# Patient Record
Sex: Female | Born: 1980 | Hispanic: No | Marital: Married | Smoking: Current every day smoker
Health system: Southern US, Community
[De-identification: ages and names within clinical notes are randomized; demographics above are authoritative.]

## PROBLEM LIST (undated history)

## (undated) DIAGNOSIS — K219 Gastro-esophageal reflux disease without esophagitis: Secondary | ICD-10-CM

## (undated) DIAGNOSIS — Z9049 Acquired absence of other specified parts of digestive tract: Secondary | ICD-10-CM

## (undated) DIAGNOSIS — T7840XA Allergy, unspecified, initial encounter: Secondary | ICD-10-CM

## (undated) DIAGNOSIS — F419 Anxiety disorder, unspecified: Secondary | ICD-10-CM

## (undated) DIAGNOSIS — J45909 Unspecified asthma, uncomplicated: Secondary | ICD-10-CM

## (undated) DIAGNOSIS — F32A Depression, unspecified: Secondary | ICD-10-CM

## (undated) HISTORY — DX: Acquired absence of other specified parts of digestive tract: Z90.49

## (undated) HISTORY — PX: TONSILLECTOMY: SUR1361

## (undated) HISTORY — DX: Gastro-esophageal reflux disease without esophagitis: K21.9

## (undated) HISTORY — DX: Unspecified asthma, uncomplicated: J45.909

## (undated) HISTORY — PX: LYMPHADENECTOMY: SHX5960

## (undated) HISTORY — DX: Allergy, unspecified, initial encounter: T78.40XA

## (undated) HISTORY — DX: Depression, unspecified: F32.A

## (undated) HISTORY — DX: Anxiety disorder, unspecified: F41.9

## (undated) HISTORY — PX: CHOLECYSTECTOMY: SHX55

---

## 2004-12-21 DIAGNOSIS — Z8742 Personal history of other diseases of the female genital tract: Secondary | ICD-10-CM

## 2004-12-21 HISTORY — DX: Personal history of other diseases of the female genital tract: Z87.42

## 2004-12-21 HISTORY — PX: COLPOSCOPY W/ BIOPSY / CURETTAGE: SUR283

## 2018-11-20 LAB — RESULTS CONSOLE HPV: CHL HPV: NEGATIVE

## 2018-11-20 LAB — HM PAP SMEAR: HM Pap smear: NORMAL

## 2020-10-01 ENCOUNTER — Ambulatory Visit (INDEPENDENT_AMBULATORY_CARE_PROVIDER_SITE_OTHER): Payer: 59 | Admitting: Medical-Surgical

## 2020-10-01 ENCOUNTER — Encounter: Payer: Self-pay | Admitting: Medical-Surgical

## 2020-10-01 VITALS — BP 124/87 | HR 72 | Temp 98.2°F | Ht 68.0 in | Wt 230.8 lb

## 2020-10-01 DIAGNOSIS — F419 Anxiety disorder, unspecified: Secondary | ICD-10-CM | POA: Diagnosis not present

## 2020-10-01 DIAGNOSIS — Z23 Encounter for immunization: Secondary | ICD-10-CM

## 2020-10-01 DIAGNOSIS — Z114 Encounter for screening for human immunodeficiency virus [HIV]: Secondary | ICD-10-CM

## 2020-10-01 DIAGNOSIS — Z7689 Persons encountering health services in other specified circumstances: Secondary | ICD-10-CM

## 2020-10-01 DIAGNOSIS — R6882 Decreased libido: Secondary | ICD-10-CM

## 2020-10-01 DIAGNOSIS — F32A Depression, unspecified: Secondary | ICD-10-CM | POA: Diagnosis not present

## 2020-10-01 DIAGNOSIS — Z1159 Encounter for screening for other viral diseases: Secondary | ICD-10-CM

## 2020-10-01 DIAGNOSIS — Z0000 Encounter for general adult medical examination without abnormal findings: Secondary | ICD-10-CM

## 2020-10-01 DIAGNOSIS — K219 Gastro-esophageal reflux disease without esophagitis: Secondary | ICD-10-CM

## 2020-10-01 NOTE — Progress Notes (Signed)
New Patient Office Visit  Subjective:  Patient ID: Martha Ware, female    DOB: Aug 04, 1981  Age: 39 y.o. MRN: [id number]  CC:  Chief Complaint  Patient presents with  . Establish Care    HPI Martha Ware presents to establish care. Relocated from [place] in April. Lives with her husband and their daughter, Martha Ware.  Depression/anxiety- worsened with moving, was prescribed medication (Effexor) which helped. Taking Bupropion 100mg  daily but was recently instructed to take it twice daily. Has not increased yet and has some questions about taking it. Feels that her anxiety is fairly constant but the depression is situational and labile. Had a deep breakthrough last week in therapy and is struggling this week to control her emotions. Has been tearful over the last couple of days. Admits that she doesn't often let herself cry. She is going to counseling once weekly.  Asthma- triggered by allergies. Albuterol inhaler prn. Now taking allergy shots to see if this will help.   Decreased libido- wonders if she is "broken" because she has no desire for sexual intimacy with her husband. She endorses being scared to get pregnant again as she already has a 23 year old daughter. The plan is for her husband to get a vasectomy but he has not done that yet. She is not interested in starting birth control at this time. Notes only one episode of intercourse in the past two years. Does not have time or resources to allow for date nights or time alone. Also, endorses difficulty with getting their schedules to mesh in order to participate in sexual relations. Does have periods of interest but these usually occur in the day or the afternoon when there is no one to take care of their daughter. Would like to avoid medication but is interested in possible ways to increase libido and resume marital relations.   Heartburn- has noticed discomfort in the upper chest in throat that is consistent with reflux each evening starting  a couple of hours before bed. Endorses waking with a bad taste in her mouth and having frequent throat clearing especially in the mornings. Takes Tums if it gets bad which helps some but has not tried any other medication.   Past Medical History:  Diagnosis Date  . Anxiety   . Asthma   . Depression   . Hx of abnormal cervical Pap smear 2006    Past Surgical History:  Procedure Laterality Date  . CHOLECYSTECTOMY    . COLPOSCOPY W/ BIOPSY / CURETTAGE  2006  . LYMPHADENECTOMY    . TONSILLECTOMY      Family History  Problem Relation Age of Onset  . Hypertension Mother   . Endometrial cancer Mother   . Healthy Father   . Hypothyroidism Maternal Grandmother   . Alcohol abuse Maternal Grandfather     Social History   Socioeconomic History  . Marital status: Married    Spouse name: Not on file  . Number of children: Not on file  . Years of education: Not on file  . Highest education level: Not on file  Occupational History  . Not on file  Tobacco Use  . Smoking status: Current Every Day Smoker    Types: E-cigarettes  . Smokeless tobacco: Never Used  Vaping Use  . Vaping Use: Every day  Substance and Sexual Activity  . Alcohol use: Yes    Alcohol/week: 10.0 standard drinks    Types: 10 Standard drinks or equivalent per week  . Drug use: Yes  Frequency: 4.0 times per week    Types: Marijuana  . Sexual activity: Not Currently    Partners: Male  Other Topics Concern  . Not on file  Social History Narrative  . Not on file   Social Determinants of Health   Financial Resource Strain:   . Difficulty of Paying Living Expenses: Not on file  Food Insecurity:   . Worried About Programme researcher, broadcasting/film/video in the Last Year: Not on file  . Ran Out of Food in the Last Year: Not on file  Transportation Needs:   . Lack of Transportation (Medical): Not on file  . Lack of Transportation (Non-Medical): Not on file  Physical Activity:   . Days of Exercise per Week: Not on file  .  Minutes of Exercise per Session: Not on file  Stress:   . Feeling of Stress : Not on file  Social Connections:   . Frequency of Communication with Friends and Family: Not on file  . Frequency of Social Gatherings with Friends and Family: Not on file  . Attends Religious Services: Not on file  . Active Member of Clubs or Organizations: Not on file  . Attends Banker Meetings: Not on file  . Marital Status: Not on file  Intimate Partner Violence:   . Fear of Current or Ex-Partner: Not on file  . Emotionally Abused: Not on file  . Physically Abused: Not on file  . Sexually Abused: Not on file    ROS Review of Systems  Constitutional: Positive for fatigue. Negative for chills and fever.  Respiratory: Negative for cough and shortness of breath.   Gastrointestinal:       Heartburn/reflux   Genitourinary: Negative for dyspareunia.  Allergic/Immunologic: Positive for environmental allergies.  Psychiatric/Behavioral: Positive for dysphoric mood. Negative for self-injury and suicidal ideas. The patient is nervous/anxious.        Decreased libido    Objective:   Today's Vitals: BP 124/87   Pulse 72   Temp 98.2 F (36.8 C) (Oral)   Ht 5\' 8"  (1.727 m)   Wt 230 lb 12.8 oz (104.7 kg)   LMP 09/23/2020 (Exact Date)   SpO2 99%   BMI 35.09 kg/m   Physical Exam Vitals and nursing note reviewed.  Constitutional:      General: She is not in acute distress.    Appearance: Normal appearance.  HENT:     Head: Normocephalic and atraumatic.  Cardiovascular:     Rate and Rhythm: Normal rate and regular rhythm.     Pulses: Normal pulses.     Heart sounds: Normal heart sounds. No murmur heard.  No friction rub. No gallop.   Pulmonary:     Effort: Pulmonary effort is normal. No respiratory distress.     Breath sounds: Normal breath sounds. No wheezing.  Skin:    General: Skin is warm and dry.  Neurological:     Mental Status: She is alert and oriented to person, place, and  time.  Psychiatric:        Attention and Perception: Attention normal.        Mood and Affect: Mood is anxious. Affect is tearful.        Speech: Speech normal.        Behavior: Behavior normal.        Thought Content: Thought content normal.        Cognition and Memory: Cognition normal.        Judgment: Judgment normal.  Assessment & Plan:   1. Encounter to establish care Reviewed available information and discussed health care concerns with patient.  We [place]  2. Anxiety/depression Continue weekly counseling.  Checking CBC, CMP, and TSH today.  Discussed increasing Wellbutrin dosage and the possibility of worsening anxiety.  She would still like to go ahead and increase this for now before considering a different medication.  Advised to take 100 mg twice daily, optimal dosing is 12 hours apart but okay to take on awakening in the morning and then again at bedtime if this is easier to remember.  If the nighttime dosing interferes with sleep, we may be able to change it to a 1 time a day extended release formula to see if that helps. - CBC - COMPLETE METABOLIC PANEL WITH GFR - TSH  3. Need for influenza vaccination Flu vaccine given in office today. - Flu Vaccine QUAD 36+ mos IM  4. Screening for HIV (human immunodeficiency virus) Discuss screening recommendations.  Patient is agreeable so we will add this to blood work today. - HIV Antibody (routine testing w rflx)  5. Need for hepatitis C screening test Discuss screening recommendations.  Patient is agreeable so we will add this to blood work today. - Hepatitis C antibody  6. Preventative health care Since she is having lab work drawn today we will go ahead and order a lipid panel for preventative health care.  She will return in a few weeks for follow-up on mood and to have her physical completed.  No preventative health care provided today. - Lipid panel  7.  Gastroesophageal reflux disease without esophagitis Discussed causes and symptoms of GERD.  On review of her diet, she does eat and drink several things that can worsen GERD.  She would like to avoid extra medications at this time and make lifestyle changes.  Provided GERD information with her AVS for review.  8.  Decreased libido Discussed the role of depression and anxiety and decreased libido.  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  Recommended behavioral modifications such as scheduled date nights/adult time where she and her husband can reconnect on an adult level.  Also discussed careful use of condoms for birth control until he has his vasectomy completed to prevent pregnancy.  We will look into further options that may be of some benefit.  As of now, she does not feel it possible to set aside date nights and adult time as they do not have the money or the resources for childcare.  Outpatient Encounter Medications as of 10/01/2020  Medication Sig  . albuterol (VENTOLIN HFA) 108 (90 Base) MCG/ACT inhaler Inhale 2 puffs into the lungs every 6 (six) hours as needed.   Marland Kitchen. b complex vitamins capsule Take 1 capsule by mouth daily.  Marland Kitchen. buPROPion (WELLBUTRIN) 100 MG tablet Take 100 mg by mouth daily.   . Cholecalciferol (VITAMIN D-3) 125 MCG (5000 UT) TABS Take 1 tablet by mouth daily.  . cloNIDine (CATAPRES) 0.1 MG tablet Take 0.1 mg by mouth daily as needed.  . fexofenadine (ALLEGRA) 180 MG tablet Take 180 mg by mouth daily.  . fluticasone (FLONASE) 50 MCG/ACT nasal spray Place 1 spray into both nostrils daily.  . Multiple Vitamin tablet Take 1 tablet by mouth daily.  . Omega-3 Fatty Acids (FISH OIL) 1000 MG CAPS  Take 1 capsule by mouth daily.   No facility-administered encounter medications on file as of 10/01/2020.    Follow-up: Return in about 4 weeks (around  10/29/2020) for mood follow up.   Thayer Ohm, DNP, APRN, FNP-BC [facility]

## 2020-10-01 NOTE — Patient Instructions (Addendum)
Influenza (Flu) Vaccine (Inactivated or Recombinant): What You Need to Know 1. Why get vaccinated? Influenza vaccine can prevent influenza (flu). Flu is a contagious disease that spreads around the [place] every year, usually between October and May. Anyone can get the flu, but it is more dangerous for some people. Infants and young children, people 38 years of age and older, pregnant women, and people with certain health conditions or a weakened immune system are at greatest risk of flu complications. Pneumonia, bronchitis, sinus infections and ear infections are examples of flu-related complications. If you have a medical condition, such as heart disease, cancer or diabetes, flu can make it worse. Flu can cause fever and chills, sore throat, muscle aches, fatigue, cough, headache, and runny or stuffy nose. Some people may have vomiting and diarrhea, though this is more common in children than adults. Each year thousands of people in the [place] die from flu, and many more are hospitalized. Flu vaccine prevents millions of illnesses and flu-related visits to the doctor each year. 2. Influenza vaccine CDC recommends everyone 57 months of age and older get vaccinated every flu season. Children 6 months through 2 years of age may need 2 doses during a single flu season. Everyone else needs only 1 dose each flu season. It takes about 2 weeks for protection to develop after vaccination. There are many flu viruses, and they are always changing. Each year a new flu vaccine is made to protect against three or four viruses that are likely to cause disease in the upcoming flu season. Even when the vaccine doesn't exactly match these viruses, it may still provide some protection. Influenza vaccine does not cause flu. Influenza vaccine may be given at the same time as other vaccines. 3. Talk with your health care provider Tell your vaccine provider if the person getting the vaccine:  Has had an  allergic reaction after a previous dose of influenza vaccine, or has any severe, life-threatening allergies.  Has ever had Guillain-Barr Syndrome (also called GBS). In some cases, your health care provider may decide to postpone influenza vaccination to a future visit. People with minor illnesses, such as a cold, may be vaccinated. People who are moderately or severely ill should usually wait until they recover before getting influenza vaccine. Your health care provider can give you more information. 4. Risks of a vaccine reaction  Soreness, redness, and swelling where shot is given, fever, muscle aches, and headache can happen after influenza vaccine.  There may be a very small increased risk of Guillain-Barr Syndrome (GBS) after inactivated influenza vaccine (the flu shot). Young children who get the flu shot along with pneumococcal vaccine (PCV13), and/or DTaP vaccine at the same time might be slightly more likely to have a seizure caused by fever. Tell your health care provider if a child who is getting flu vaccine has ever had a seizure. People sometimes faint after medical procedures, including vaccination. Tell your provider if you feel dizzy or have vision changes or ringing in the ears. As with any medicine, there is a very remote chance of a vaccine causing a severe allergic reaction, other serious injury, or death. 5. What if there is a serious problem? An allergic reaction could occur after the vaccinated person leaves the clinic. If you see signs of a severe allergic reaction (hives, swelling of the face and throat, difficulty breathing, a fast heartbeat, dizziness, or weakness), call 9-1-1 and get the person to the nearest hospital. For other signs that  concern you, call your health care provider. Adverse reactions should be reported to the Vaccine Adverse Event Reporting System (VAERS). Your health care provider will usually file this report, or you can do it yourself. Visit the  VAERS website at www.vaers.LAgents.no or call (670)356-7045.VAERS is only for reporting reactions, and VAERS staff do not give medical advice. 6. The National Vaccine Injury Compensation Program The [employer] Vaccine Injury Compensation Program (VICP) is a federal program that was created to compensate people who may have been injured by certain vaccines. Visit the VICP website at SpiritualWord.at or call (415)112-0681 to learn about the program and about filing a claim. There is a time limit to file a claim for compensation. 7. How can I learn more?  Ask your healthcare provider.  Call your local or state health department.  Contact the Centers for Disease Control and Prevention (CDC): ? Call (330) 747-0319 (1-800-CDC-INFO) or ? Visit CDC's BiotechRoom.com.cy Vaccine Information Statement (Interim) Inactivated Influenza Vaccine (08/04/2018) This information is not intended to replace advice given to you by your health care provider. Make sure you discuss any questions you have with your health care provider. Document Revised: 03/28/2019 Document Reviewed: 08/08/2018 Elsevier Patient Education  2020 Elsevier Inc.   Gastroesophageal Reflux Disease, Adult Gastroesophageal reflux (GER) happens when acid from the stomach flows up into the tube that connects the mouth and the stomach (esophagus). Normally, food travels down the esophagus and stays in the stomach to be digested. With GER, food and stomach acid sometimes move back up into the esophagus. You may have a disease called gastroesophageal reflux disease (GERD) if the reflux:  Happens often.  Causes frequent or very bad symptoms.  Causes problems such as damage to the esophagus. When this happens, the esophagus becomes sore and swollen (inflamed). Over time, GERD can make small holes (ulcers) in the lining of the esophagus. What are the causes? This condition is caused by a problem with the muscle between the esophagus and  the stomach. When this muscle is weak or not normal, it does not close properly to keep food and acid from coming back up from the stomach. The muscle can be weak because of:  Tobacco use.  Pregnancy.  Having a certain type of hernia (hiatal hernia).  Alcohol use.  Certain foods and drinks, such as coffee, chocolate, onions, and peppermint. What increases the risk? You are more likely to develop this condition if you:  Are overweight.  Have a disease that affects your connective tissue.  Use NSAID medicines. What are the signs or symptoms? Symptoms of this condition include:  Heartburn.  Difficult or painful swallowing.  The feeling of having a lump in the throat.  A bitter taste in the mouth.  Bad breath.  Having a lot of saliva.  Having an upset or bloated stomach.  Belching.  Chest pain. Different conditions can cause chest pain. Make sure you see your doctor if you have chest pain.  Shortness of breath or noisy breathing (wheezing).  Ongoing (chronic) cough or a cough at night.  Wearing away of the surface of teeth (tooth enamel).  Weight loss. How is this treated? Treatment will depend on how bad your symptoms are. Your doctor may suggest:  Changes to your diet.  Medicine.  Surgery. Follow these instructions at home: Eating and drinking   Follow a diet as told by your doctor. You may need to avoid foods and drinks such as: ? Coffee and tea (with or without caffeine). ? Drinks that contain  alcohol. ? Energy drinks and sports drinks. ? Bubbly (carbonated) drinks or sodas. ? Chocolate and cocoa. ? Peppermint and mint flavorings. ? Garlic and onions. ? Horseradish. ? Spicy and acidic foods. These include peppers, chili powder, curry powder, vinegar, hot sauces, and BBQ sauce. ? Citrus fruit juices and citrus fruits, such as oranges, lemons, and limes. ? Tomato-based foods. These include red sauce, chili, salsa, and pizza with red sauce. ? Fried  and fatty foods. These include donuts, french fries, potato chips, and high-fat dressings. ? High-fat meats. These include hot dogs, rib eye steak, sausage, ham, and bacon. ? High-fat dairy items, such as whole milk, butter, and cream cheese.  Eat small meals often. Avoid eating large meals.  Avoid drinking large amounts of liquid with your meals.  Avoid eating meals during the 2-3 hours before bedtime.  Avoid lying down right after you eat.  Do not exercise right after you eat. Lifestyle   Do not use any products that contain nicotine or tobacco. These include cigarettes, e-cigarettes, and chewing tobacco. If you need help quitting, ask your doctor.  Try to lower your stress. If you need help doing this, ask your doctor.  If you are overweight, lose an amount of weight that is healthy for you. Ask your doctor about a safe weight loss goal. General instructions  Pay attention to any changes in your symptoms.  Take over-the-counter and prescription medicines only as told by your doctor. Do not take aspirin, ibuprofen, or other NSAIDs unless your doctor says it is okay.  Wear loose clothes. Do not wear anything tight around your waist.  Raise (elevate) the head of your bed about 6 inches (15 cm).  Avoid bending over if this makes your symptoms worse.  Keep all follow-up visits as told by your doctor. This is important. Contact a doctor if:  You have new symptoms.  You lose weight and you do not know why.  You have trouble swallowing or it hurts to swallow.  You have wheezing or a cough that keeps happening.  Your symptoms do not get better with treatment.  You have a hoarse voice. Get help right away if:  You have pain in your arms, neck, jaw, teeth, or back.  You feel sweaty, dizzy, or light-headed.  You have chest pain or shortness of breath.  You throw up (vomit) and your throw-up looks like blood or coffee grounds.  You pass out (faint).  Your poop (stool)  is bloody or black.  You cannot swallow, drink, or eat. Summary  If a person has gastroesophageal reflux disease (GERD), food and stomach acid move back up into the esophagus and cause symptoms or problems such as damage to the esophagus.  Treatment will depend on how bad your symptoms are.  Follow a diet as told by your doctor.  Take all medicines only as told by your doctor. This information is not intended to replace advice given to you by your health care provider. Make sure you discuss any questions you have with your health care provider. Document Revised: 06/15/2018 Document Reviewed: 06/15/2018 Elsevier Patient Education  2020 [employer].

## 2020-10-02 ENCOUNTER — Encounter: Payer: Self-pay | Admitting: Medical-Surgical

## 2020-10-02 LAB — COMPLETE METABOLIC PANEL WITH GFR
AG Ratio: 1.7 (calc) (ref 1.0–2.5)
ALT: 20 U/L (ref 6–29)
AST: 15 U/L (ref 10–30)
Albumin: 4.4 g/dL (ref 3.6–5.1)
Alkaline phosphatase (APISO): 50 U/L (ref 31–125)
BUN: 9 mg/dL (ref 7–25)
CO2: 27 mmol/L (ref 20–32)
Calcium: 9.2 mg/dL (ref 8.6–10.2)
Chloride: 103 mmol/L (ref 98–110)
Creat: 0.83 mg/dL (ref 0.50–1.10)
GFR, Est African American: 104 mL/min/{1.73_m2} (ref >=60)
GFR, Est Non African American: 89 mL/min/{1.73_m2} (ref >=60)
Globulin: 2.6 g/dL (calc) (ref 1.9–3.7)
Glucose, Bld: 97 mg/dL (ref 65–99)
Potassium: 3.9 mmol/L (ref 3.5–5.3)
Sodium: 139 mmol/L (ref 135–146)
Total Bilirubin: 0.7 mg/dL (ref 0.2–1.2)
Total Protein: 7 g/dL (ref 6.1–8.1)

## 2020-10-02 LAB — LIPID PANEL
Cholesterol: 156 mg/dL (ref <200)
HDL: 58 mg/dL (ref >=50)
LDL Cholesterol (Calc): 80 mg/dL (calc)
Non-HDL Cholesterol (Calc): 98 mg/dL (calc) (ref <130)
Total CHOL/HDL Ratio: 2.7 (calc) (ref <5.0)
Triglycerides: 101 mg/dL (ref <150)

## 2020-10-02 LAB — CBC
HCT: 37.6 % (ref 35.0–45.0)
Hemoglobin: 12.7 g/dL (ref 11.7–15.5)
MCH: 29.7 pg (ref 27.0–33.0)
MCHC: 33.8 g/dL (ref 32.0–36.0)
MCV: 87.9 fL (ref 80.0–100.0)
MPV: 10 fL (ref 7.5–12.5)
Platelets: 264 10*3/uL (ref 140–400)
RBC: 4.28 10*6/uL (ref 3.80–5.10)
RDW: 13 % (ref 11.0–15.0)
WBC: 9.2 10*3/uL (ref 3.8–10.8)

## 2020-10-02 LAB — TSH: TSH: 2.09 mIU/L

## 2020-10-03 NOTE — Telephone Encounter (Signed)
Patient states she does not need an appointment at this time.

## 2020-10-29 ENCOUNTER — Ambulatory Visit (INDEPENDENT_AMBULATORY_CARE_PROVIDER_SITE_OTHER): Payer: 59 | Admitting: Medical-Surgical

## 2020-10-29 ENCOUNTER — Other Ambulatory Visit: Payer: Self-pay

## 2020-10-29 ENCOUNTER — Encounter: Payer: Self-pay | Admitting: Medical-Surgical

## 2020-10-29 VITALS — BP 122/83 | HR 72 | Temp 98.2°F | Ht 68.0 in | Wt 232.2 lb

## 2020-10-29 DIAGNOSIS — Z0000 Encounter for general adult medical examination without abnormal findings: Secondary | ICD-10-CM | POA: Diagnosis not present

## 2020-10-29 MED ORDER — BUPROPION HCL 100 MG PO TABS: 100.0000 mg | ORAL_TABLET | Freq: Two times a day (BID) | ORAL | 1 refills | Status: DC

## 2020-10-29 NOTE — Progress Notes (Signed)
HPI: Martha Ware is a 39 y.o. female who  has a past medical history of Anxiety, Asthma, Depression, and abnormal cervical Pap smear (2006).  she presents to [facility] today, 10/29/20,  for chief complaint of: Annual physical exam  Dentist: on the to do list, last visit 12/2017 Eye exam: due now, looking for ophthalmologist, wears glasses Exercise: walks, elliptical, intermittent weight machine Diet: no red meats, used to be vegan and leans towards healthy choices Pap smear: done after her daughter was born (she's 3y next weekend) COVID vaccine: J&J done, questions about boosters  Concerns: None  Past medical, surgical, social and family history reviewed:  Patient Active Problem List   Diagnosis Date Noted  . Anxiety 10/01/2020  . Depression 10/01/2020  . Gastroesophageal reflux disease without esophagitis 10/01/2020  . Decreased libido 10/01/2020    Past Surgical History:  Procedure Laterality Date  . CHOLECYSTECTOMY    . COLPOSCOPY W/ BIOPSY / CURETTAGE  2006  . LYMPHADENECTOMY    . TONSILLECTOMY      Social History   Tobacco Use  . Smoking status: Current Every Day Smoker    Types: E-cigarettes  . Smokeless tobacco: Never Used  Substance Use Topics  . Alcohol use: Yes    Alcohol/week: 10.0 standard drinks    Types: 10 Standard drinks or equivalent per week    Family History  Problem Relation Age of Onset  . Hypertension Mother   . Endometrial cancer Mother   . Healthy Father   . Hypothyroidism Maternal Grandmother   . Alcohol abuse Maternal Grandfather      Current medication list and allergy/intolerance information reviewed:    Current Outpatient Medications  Medication Sig Dispense Refill  . albuterol (VENTOLIN HFA) 108 (90 Base) MCG/ACT inhaler Inhale 2 puffs into the lungs every 6 (six) hours as needed.     Marland Kitchen b complex vitamins capsule Take 1 capsule by mouth daily.    Marland Kitchen buPROPion (WELLBUTRIN) 100 MG tablet Take  1 tablet (100 mg total) by mouth 2 (two) times daily. 180 tablet 1  . Cholecalciferol (VITAMIN D-3) 125 MCG (5000 UT) TABS Take 1 tablet by mouth daily.    . cloNIDine (CATAPRES) 0.1 MG tablet Take 0.1 mg by mouth daily as needed.    . fexofenadine (ALLEGRA) 180 MG tablet Take 180 mg by mouth daily.    . fluticasone (FLONASE) 50 MCG/ACT nasal spray Place 1 spray into both nostrils daily.    . Multiple Vitamin tablet Take 1 tablet by mouth daily.    . Omega-3 Fatty Acids (FISH OIL) 1000 MG CAPS Take 1 capsule by mouth daily.     No current facility-administered medications for this visit.   Allergies  Allergen Reactions  . Codeine Nausea And Vomiting   Review of Systems:  Constitutional:  No  fever, no chills, No recent illness, No unintentional weight changes. No significant fatigue.   HEENT: No headache, no vision change, no hearing change, No sore throat, + sinus pressure, + PND  Cardiac: No  chest pain, No  pressure, No palpitations, No  Orthopnea  Respiratory:  No  shortness of breath. + Cough, + wheeze  Gastrointestinal: No  abdominal pain, No  nausea, No  vomiting,  No  blood in stool, No  diarrhea, No  constipation   Musculoskeletal: No new myalgia/arthralgia  Skin: No  Rash, No other wounds/concerning lesions  Genitourinary: No  incontinence, No  abnormal genital bleeding, No abnormal genital discharge  Hem/Onc: +  easy bruising, No abnormal lymph node  Endocrine: No cold intolerance,  No heat intolerance. No polyuria/polydipsia/polyphagia   Neurologic: No  weakness, No  dizziness, No  slurred speech/focal weakness/facial droop  Psychiatric: No  concerns with depression, No  concerns with anxiety, + sleep problems, No mood problems  Exam:  BP 122/83   Pulse 72   Temp 98.2 F (36.8 C) (Oral)   Ht _0  (1.727 m)   Wt 232 lb 3.2 oz (105.3 kg)   LMP 10/17/2020   SpO2 97%   BMI 35.31 kg/m   Constitutional: VS see above. General Appearance: alert, well-developed,  well-nourished, NAD  Eyes: Normal lids and conjunctive, non-icteric sclera  Ears, Nose, Mouth, Throat: MMM, Normal external inspection ears. TM normal bilaterally.  Neck: No masses, trachea midline. No thyroid enlargement. No tenderness/mass appreciated. No lymphadenopathy  Respiratory: Normal respiratory effort. no wheeze, no rhonchi, no rales  Cardiovascular: S1/S2 normal, no murmur, no rub/gallop auscultated. RRR. No lower extremity edema. No carotid bruit or JVD. No abdominal aortic bruit.  Gastrointestinal: Nontender, no masses. No hepatomegaly, no splenomegaly. No hernia appreciated. Bowel sounds normal. Rectal exam deferred.   Musculoskeletal: Gait normal. No clubbing/cyanosis of digits.   Neurological: Normal balance/coordination. No tremor. No cranial nerve deficit on limited exam. Motor and sensation intact and symmetric. Cerebellar reflexes intact.   Skin: warm, dry, intact. No rash/ulcer. No concerning nevi or subq nodules on limited exam.    Psychiatric: Normal judgment/insight. Normal mood and affect. Oriented x3.   No results found for this or any previous visit (from the past 72 hour(s)).  No results found.  ASSESSMENT/PLAN:   1. Annual physical exam Preventative care labs already completed. Up to date on well woman care. Wellbutrin refilled at patient request at new dose fo 119m BID.   No orders of the defined types were placed in this encounter.  Meds ordered this encounter  Medications  . buPROPion (WELLBUTRIN) 100 MG tablet    Sig: Take 1 tablet (100 mg total) by mouth 2 (two) times daily.    Dispense:  180 tablet    Refill:  1    Order Specific Question:   Supervising Provider    Answer:   AEmeterio Reeve[[[id number]]   Patient Instructions  Preventive Care [age]Years Old, Female Preventive care refers to visits with your health care provider and lifestyle choices that can promote health and wellness. This includes:  A yearly physical exam. This  may also be called an annual well check.  Regular dental visits and eye exams.  Immunizations.  Screening for certain conditions.  Healthy lifestyle choices, such as eating a healthy diet, getting regular exercise, not using drugs or products that contain nicotine and tobacco, and limiting alcohol use. What can I expect for my preventive care visit? Physical exam Your health care provider will check your:  Height and weight. This may be used to calculate body mass index (BMI), which tells if you are at a healthy weight.  Heart rate and blood pressure.  Skin for abnormal spots. Counseling Your health care provider may ask you questions about your:  Alcohol, tobacco, and drug use.  Emotional well-being.  Home and relationship well-being.  Sexual activity.  Eating habits.  Work and work eStatistician  Method of birth control.  Menstrual cycle.  Pregnancy history. What immunizations do I need?  Influenza (flu) vaccine  This is recommended every year. Tetanus, diphtheria, and pertussis (Tdap) vaccine  You may need a Td booster every  10 years. Varicella (chickenpox) vaccine  You may need this if you have not been vaccinated. Human papillomavirus (HPV) vaccine  If recommended by your health care provider, you may need three doses over 6 months. Measles, mumps, and rubella (MMR) vaccine  You may need at least one dose of MMR. You may also need a second dose. Meningococcal conjugate (MenACWY) vaccine  One dose is recommended if you are age 65-21 years and a first-year college student living in a residence hall, or if you have one of several medical conditions. You may also need additional booster doses. Pneumococcal conjugate (PCV13) vaccine  You may need this if you have certain conditions and were not previously vaccinated. Pneumococcal polysaccharide (PPSV23) vaccine  You may need one or two doses if you smoke cigarettes or if you have certain  conditions. Hepatitis A vaccine  You may need this if you have certain conditions or if you travel or work in places where you may be exposed to hepatitis A. Hepatitis B vaccine  You may need this if you have certain conditions or if you travel or work in places where you may be exposed to hepatitis B. Haemophilus influenzae type b (Hib) vaccine  You may need this if you have certain conditions. You may receive vaccines as individual doses or as more than one vaccine together in one shot (combination vaccines). Talk with your health care provider about the risks and benefits of combination vaccines. What tests do I need?  Blood tests  Lipid and cholesterol levels. These may be checked every 5 years starting at age 12.  Hepatitis C test.  Hepatitis B test. Screening  Diabetes screening. This is done by checking your blood sugar (glucose) after you have not eaten for a while (fasting).  Sexually transmitted disease (STD) testing.  BRCA-related cancer screening. This may be done if you have a family history of breast, ovarian, tubal, or peritoneal cancers.  Pelvic exam and Pap test. This may be done every 3 years starting at age 29. Starting at age 80, this may be done every 5 years if you have a Pap test in combination with an HPV test. Talk with your health care provider about your test results, treatment options, and if necessary, the need for more tests. Follow these instructions at home: Eating and drinking   Eat a diet that includes fresh fruits and vegetables, whole grains, lean protein, and low-fat dairy.  Take vitamin and mineral supplements as recommended by your health care provider.  Do not drink alcohol if: ? Your health care provider tells you not to drink. ? You are pregnant, may be pregnant, or are planning to become pregnant.  If you drink alcohol: ? Limit how much you have to 0-1 drink a day. ? Be aware of how much alcohol is in your drink. In the [place] one  drink equals one 12 oz bottle of beer (355 mL), one 5 oz glass of wine (148 mL), or one 1 oz glass of hard liquor (44 mL). Lifestyle  Take daily care of your teeth and gums.  Stay active. Exercise for at least 30 minutes on 5 or more days each week.  Do not use any products that contain nicotine or tobacco, such as cigarettes, e-cigarettes, and chewing tobacco. If you need help quitting, ask your health care provider.  If you are sexually active, practice safe sex. Use a condom or other form of birth control (contraception) in order to prevent pregnancy and STIs (sexually transmitted  infections). If you plan to become pregnant, see your health care provider for a preconception visit. What's next?  Visit your health care provider once a year for a well check visit.  Ask your health care provider how often you should have your eyes and teeth checked.  Stay up to date on all vaccines. This information is not intended to replace advice given to you by your health care provider. Make sure you discuss any questions you have with your health care provider. Document Revised: 08/18/2018 Document Reviewed: 08/18/2018 Elsevier Patient Education  [place].  Follow-up plan: Return in about 1 year (around 10/29/2021) for annual physical exam or sooner if needed.  Clearnce Sorrel, DNP, APRN, FNP-BC [facility]

## 2020-10-29 NOTE — Patient Instructions (Signed)
Preventive Care 38-38 Years Old, Female Preventive care refers to visits with your health care provider and lifestyle choices that can promote health and wellness. This includes:  A yearly physical exam. This may also be called an annual well check.  Regular dental visits and eye exams.  Immunizations.  Screening for certain conditions.  Healthy lifestyle choices, such as eating a healthy diet, getting regular exercise, not using drugs or products that contain nicotine and tobacco, and limiting alcohol use. What can I expect for my preventive care visit? Physical exam Your health care provider will check your:  Height and weight. This may be used to calculate body mass index (BMI), which tells if you are at a healthy weight.  Heart rate and blood pressure.  Skin for abnormal spots. Counseling Your health care provider may ask you questions about your:  Alcohol, tobacco, and drug use.  Emotional well-being.  Home and relationship well-being.  Sexual activity.  Eating habits.  Work and work environment.  Method of birth control.  Menstrual cycle.  Pregnancy history. What immunizations do I need?  Influenza (flu) vaccine  This is recommended every year. Tetanus, diphtheria, and pertussis (Tdap) vaccine  You may need a Td booster every 10 years. Varicella (chickenpox) vaccine  You may need this if you have not been vaccinated. Human papillomavirus (HPV) vaccine  If recommended by your health care provider, you may need three doses over 6 months. Measles, mumps, and rubella (MMR) vaccine  You may need at least one dose of MMR. You may also need a second dose. Meningococcal conjugate (MenACWY) vaccine  One dose is recommended if you are age 19-21 years and a first-year college student living in a residence hall, or if you have one of several medical conditions. You may also need additional booster doses. Pneumococcal conjugate (PCV13) vaccine  You may need  this if you have certain conditions and were not previously vaccinated. Pneumococcal polysaccharide (PPSV23) vaccine  You may need one or two doses if you smoke cigarettes or if you have certain conditions. Hepatitis A vaccine  You may need this if you have certain conditions or if you travel or work in places where you may be exposed to hepatitis A. Hepatitis B vaccine  You may need this if you have certain conditions or if you travel or work in places where you may be exposed to hepatitis B. Haemophilus influenzae type b (Hib) vaccine  You may need this if you have certain conditions. You may receive vaccines as individual doses or as more than one vaccine together in one shot (combination vaccines). Talk with your health care provider about the risks and benefits of combination vaccines. What tests do I need?  Blood tests  Lipid and cholesterol levels. These may be checked every 5 years starting at age 20.  Hepatitis C test.  Hepatitis B test. Screening  Diabetes screening. This is done by checking your blood sugar (glucose) after you have not eaten for a while (fasting).  Sexually transmitted disease (STD) testing.  BRCA-related cancer screening. This may be done if you have a family history of breast, ovarian, tubal, or peritoneal cancers.  Pelvic exam and Pap test. This may be done every 3 years starting at age 21. Starting at age 30, this may be done every 5 years if you have a Pap test in combination with an HPV test. Talk with your health care provider about your test results, treatment options, and if necessary, the need for more tests.   Follow these instructions at home: Eating and drinking   Eat a diet that includes fresh fruits and vegetables, whole grains, lean protein, and low-fat dairy.  Take vitamin and mineral supplements as recommended by your health care provider.  Do not drink alcohol if: ? Your health care provider tells you not to drink. ? You are  pregnant, may be pregnant, or are planning to become pregnant.  If you drink alcohol: ? Limit how much you have to 0-1 drink a day. ? Be aware of how much alcohol is in your drink. In the [place] one drink equals one 12 oz bottle of beer (355 mL), one 5 oz glass of wine (148 mL), or one 1 oz glass of hard liquor (44 mL). Lifestyle  Take daily care of your teeth and gums.  Stay active. Exercise for at least 30 minutes on 5 or more days each week.  Do not use any products that contain nicotine or tobacco, such as cigarettes, e-cigarettes, and chewing tobacco. If you need help quitting, ask your health care provider.  If you are sexually active, practice safe sex. Use a condom or other form of birth control (contraception) in order to prevent pregnancy and STIs (sexually transmitted infections). If you plan to become pregnant, see your health care provider for a preconception visit. What's next?  Visit your health care provider once a year for a well check visit.  Ask your health care provider how often you should have your eyes and teeth checked.  Stay up to date on all vaccines. This information is not intended to replace advice given to you by your health care provider. Make sure you discuss any questions you have with your health care provider. Document Revised: 08/18/2018 Document Reviewed: 08/18/2018 Elsevier Patient Education  2020 [employer].

## 2021-04-27 ENCOUNTER — Other Ambulatory Visit: Payer: Self-pay | Admitting: Medical-Surgical

## 2021-07-22 ENCOUNTER — Encounter: Payer: Self-pay | Admitting: Medical-Surgical

## 2021-07-22 MED ORDER — BUPROPION HCL 100 MG PO TABS: 100.0000 mg | ORAL_TABLET | Freq: Two times a day (BID) | ORAL | 0 refills | Status: DC

## 2021-07-22 NOTE — Telephone Encounter (Signed)
Please look in the medical record as well as our box for records for Najia from [place].  If they are not available or you cannot find them, can you please fax the request and the release of information to the number in her MyChart message?  Thayer Ohm, DNP, APRN, FNP-BC [facility]

## 2021-07-24 ENCOUNTER — Telehealth: Payer: Self-pay

## 2021-07-24 NOTE — Telephone Encounter (Signed)
Faxed [place] medical records request per pt's mychart message.  Fax number - [phone]

## 2021-08-06 ENCOUNTER — Telehealth: Payer: 59 | Admitting: Physician Assistant

## 2021-08-06 DIAGNOSIS — N390 Urinary tract infection, site not specified: Secondary | ICD-10-CM

## 2021-08-06 MED ORDER — CEPHALEXIN 500 MG PO CAPS: 500.0000 mg | ORAL_CAPSULE | Freq: Two times a day (BID) | ORAL | 0 refills | Status: AC

## 2021-08-06 NOTE — Patient Instructions (Signed)
Pollyann Samples, thank you for joining [name], PA-C for today's virtual visit.  While this provider is not your primary care provider (PCP), if your PCP is located in our provider database this encounter information will be shared with them immediately following your visit.  Consent: (Patient) Martha Ware provided verbal consent for this virtual visit at the beginning of the encounter.  Current Medications:  Current Outpatient Medications:    albuterol (VENTOLIN HFA) 108 (90 Base) MCG/ACT inhaler, Inhale 2 puffs into the lungs every 6 (six) hours as needed. , Disp: , Rfl:    b complex vitamins capsule, Take 1 capsule by mouth daily., Disp: , Rfl:    buPROPion (WELLBUTRIN) 100 MG tablet, Take 1 tablet (100 mg total) by mouth 2 (two) times daily., Disp: 180 tablet, Rfl: 0   Cholecalciferol (VITAMIN D-3) 125 MCG (5000 UT) TABS, Take 1 tablet by mouth daily., Disp: , Rfl:    cloNIDine (CATAPRES) 0.1 MG tablet, Take 0.1 mg by mouth daily as needed., Disp: , Rfl:    fexofenadine (ALLEGRA) 180 MG tablet, Take 180 mg by mouth daily., Disp: , Rfl:    fluticasone (FLONASE) 50 MCG/ACT nasal spray, Place 1 spray into both nostrils daily., Disp: , Rfl:    Multiple Vitamin tablet, Take 1 tablet by mouth daily., Disp: , Rfl:    Omega-3 Fatty Acids (FISH OIL) 1000 MG CAPS, Take 1 capsule by mouth daily., Disp: , Rfl:    Medications ordered in this encounter:  No orders of the defined types were placed in this encounter.    *If you need refills on other medications prior to your next appointment, please contact your pharmacy*  Follow-Up: Call back or seek an in-person evaluation if the symptoms worsen or if the condition fails to improve as anticipated.  Other Instructions Your symptoms are consistent with a bladder infection, also called acute cystitis. Please take your antibiotic (Keflex) as directed until all pills are gone.  Stay very well hydrated.  Consider a daily probiotic (Align,  Culturelle, or Activia) to help prevent stomach upset caused by the antibiotic.  Taking a probiotic daily may also help prevent recurrent UTIs.  Also consider taking AZO (Phenazopyridine) tablets to help decrease pain with urination.    If symptoms are not resolving, anything worsens or new symptoms develop, you will need an in-person evaluation.   Urinary Tract Infection A urinary tract infection (UTI) can occur any place along the urinary tract. The tract includes the kidneys, ureters, bladder, and urethra. A type of germ called bacteria often causes a UTI. UTIs are often helped with antibiotic medicine.  HOME CARE  If given, take antibiotics as told by your doctor. Finish them even if you start to feel better. Drink enough fluids to keep your pee (urine) clear or pale yellow. Avoid tea, drinks with caffeine, and bubbly (carbonated) drinks. Pee often. Avoid holding your pee in for a long time. Pee before and after having sex (intercourse). Wipe from front to back after you poop (bowel movement) if you are a woman. Use each tissue only once. GET HELP RIGHT AWAY IF:  You have back pain. You have lower belly (abdominal) pain. You have chills. You feel sick to your stomach (nauseous). You throw up (vomit). Your burning or discomfort with peeing does not go away. You have a fever. Your symptoms are not better in 3 days. MAKE SURE YOU:  Understand these instructions. Will watch your condition. Will get help right away if you are  not doing well or get worse. Document Released: 05/25/2008 Document Revised: 08/31/2012 Document Reviewed: 07/07/2012 [facility] Patient Information 2015 [place], [place]. This information is not intended to replace advice given to you by your health care provider. Make sure you discuss any questions you have with your health care provider.    If you have been instructed to have an in-person evaluation today at a local Urgent Care facility, please use the link below.  It will take you to a list of all of our available [facility], including address, phone number and hours of operation. Please do not delay care.  [facility]  If you or a family member do not have a primary care provider, use the link below to schedule a visit and establish care. When you choose a [facility] primary care physician or advanced practice provider, you gain a long-term partner in health. Find a Primary Care Provider  Learn more about [facility]'s in-office and virtual care options: [facility] - Get Care Now

## 2021-08-06 NOTE — Progress Notes (Signed)
Virtual Visit Consent   [name], you are scheduled for a virtual visit with a [facility] provider today.     Just as with appointments in the office, your consent must be obtained to participate.  Your consent will be active for this visit and any virtual visit you may have with one of our providers in the next 365 days.     If you have a MyChart account, a copy of this consent can be sent to you electronically.  All virtual visits are billed to your insurance company just like a traditional visit in the office.    As this is a virtual visit, video technology does not allow for your provider to perform a traditional examination.  This may limit your provider's ability to fully assess your condition.  If your provider identifies any concerns that need to be evaluated in person or the need to arrange testing (such as labs, EKG, etc.), we will make arrangements to do so.     Although advances in technology are sophisticated, we cannot ensure that it will always work on either your end or our end.  If the connection with a video visit is poor, the visit may have to be switched to a telephone visit.  With either a video or telephone visit, we are not always able to ensure that we have a secure connection.     I need to obtain your verbal consent now.   Are you willing to proceed with your visit today?    Savvy Peeters has provided verbal consent on 08/06/2021 for a virtual visit (video or telephone).   Martha Ware, [place]   Date: 08/06/2021 12:52 PM   Virtual Visit via Video Note   I, Martha Ware, connected with  Kennedie Pardoe  ([id number], November 13, 1981) on 08/06/21 at 12:45 PM EDT by a video-enabled telemedicine application and verified that I am speaking with the correct person using two identifiers.  Location: Patient: Virtual Visit Location Patient: Home Provider: Virtual Visit Location Provider: Home Office   I discussed the limitations of evaluation and management by  telemedicine and the availability of in person appointments. The patient expressed understanding and agreed to proceed.    History of Present Illness: Martha Ware is a 40 y.o. who identifies as a female who was assigned female at birth, and is being seen today for possible UTI. Patient endorses symptoms starting Friday-Saturday with suprapubic pressure, urgency, frequency, incomplete bladder emptying and mild dysuria. As of this morning she is noting lower back pain. Denies fever, chills. Has noted nausea without any vomiting. Denies flank pain. LMP ended 07/25/2021. Started increasing fluid intake and starting cranberry juice over the weekend.   HPI: HPI  Problems:  Patient Active Problem List   Diagnosis Date Noted   Anxiety 10/01/2020   Depression 10/01/2020   Gastroesophageal reflux disease without esophagitis 10/01/2020   Decreased libido 10/01/2020    Allergies:  Allergies  Allergen Reactions   Codeine Nausea And Vomiting   Medications:  Current Outpatient Medications:    albuterol (VENTOLIN HFA) 108 (90 Base) MCG/ACT inhaler, Inhale 2 puffs into the lungs every 6 (six) hours as needed. , Disp: , Rfl:    b complex vitamins capsule, Take 1 capsule by mouth daily., Disp: , Rfl:    buPROPion (WELLBUTRIN) 100 MG tablet, Take 1 tablet (100 mg total) by mouth 2 (two) times daily., Disp: 180 tablet, Rfl: 0   cephALEXin (KEFLEX) 500 MG capsule, Take 1 capsule (500 mg  total) by mouth 2 (two) times daily for 7 days., Disp: 14 capsule, Rfl: 0   Cholecalciferol (VITAMIN D-3) 125 MCG (5000 UT) TABS, Take 1 tablet by mouth daily., Disp: , Rfl:    cloNIDine (CATAPRES) 0.1 MG tablet, Take 0.1 mg by mouth daily as needed., Disp: , Rfl:    fexofenadine (ALLEGRA) 180 MG tablet, Take 180 mg by mouth daily., Disp: , Rfl:    fluticasone (FLONASE) 50 MCG/ACT nasal spray, Place 1 spray into both nostrils daily., Disp: , Rfl:    Multiple Vitamin tablet, Take 1 tablet by mouth daily., Disp: , Rfl:     Omega-3 Fatty Acids (FISH OIL) 1000 MG CAPS, Take 1 capsule by mouth daily., Disp: , Rfl:   Observations/Objective: Patient is well-developed, well-nourished in no acute distress.  Resting comfortably at home.  Head is normocephalic, atraumatic.  No labored breathing. Speech is clear and coherent with logical content.  Patient is alert and oriented at baseline.   Assessment and Plan: 1. UTI (urinary tract infection), uncomplicated - cephALEXin (KEFLEX) 500 MG capsule; Take 1 capsule (500 mg total) by mouth 2 (two) times daily for 7 days.  Dispense: 14 capsule; Refill: 0 No alarm signs or symptoms. Classic UTI symptoms. Will treat empirically for uncomplicated cystitis with Keflex 500 mg BID x 7 days. Supportive measures and OTC medications reviewed. She is aware she will need an in-person evaluation for any non-resolving, new or worsening symptoms.   Follow Up Instructions: I discussed the assessment and treatment plan with the patient. The patient was provided an opportunity to ask questions and all were answered. The patient agreed with the plan and demonstrated an understanding of the instructions.  A copy of instructions were sent to the patient via MyChart.  The patient was advised to call back or seek an in-person evaluation if the symptoms worsen or if the condition fails to improve as anticipated.  Time:  I spent 12 minutes with the patient via telehealth technology discussing the above problems/concerns.    Martha Climes, PA-C

## 2021-09-22 ENCOUNTER — Other Ambulatory Visit: Payer: Self-pay

## 2021-09-22 ENCOUNTER — Ambulatory Visit (INDEPENDENT_AMBULATORY_CARE_PROVIDER_SITE_OTHER): Payer: 59 | Admitting: Medical-Surgical

## 2021-09-22 ENCOUNTER — Encounter: Payer: Self-pay | Admitting: Medical-Surgical

## 2021-09-22 VITALS — BP 113/80 | HR 67 | Resp 20 | Ht 68.0 in | Wt 245.0 lb

## 2021-09-22 DIAGNOSIS — K219 Gastro-esophageal reflux disease without esophagitis: Secondary | ICD-10-CM

## 2021-09-22 DIAGNOSIS — F419 Anxiety disorder, unspecified: Secondary | ICD-10-CM

## 2021-09-22 DIAGNOSIS — F32A Depression, unspecified: Secondary | ICD-10-CM

## 2021-09-22 DIAGNOSIS — N644 Mastodynia: Secondary | ICD-10-CM

## 2021-09-22 MED ORDER — BUPROPION HCL 100 MG PO TABS: 100.0000 mg | ORAL_TABLET | Freq: Two times a day (BID) | ORAL | 0 refills | Status: DC

## 2021-09-22 MED ORDER — PANTOPRAZOLE SODIUM 40 MG PO TBEC: 40.0000 mg | DELAYED_RELEASE_TABLET | Freq: Every day | ORAL | 3 refills | Status: DC

## 2021-09-22 NOTE — Patient Instructions (Signed)

## 2021-09-22 NOTE — Progress Notes (Signed)
HPI with pertinent ROS:   CC: Reflux, left nipple pain, mood follow-up  HPI: Pleasant 40 year old female presenting originally for physical however we had to change this to a problem visit as her last physical was less than 1 year ago.  GERD-this has been a longstanding issue and she has tried multiple things including diet changes, and avoidance of known triggers.  She continues to have significant issues with reflux and abdominal pain.  She is sleeping on 2 pillows at night.  She does have a dry cough.  Notes that her symptoms are very random and occur at various times and with various activities.  She is having regular bowel movements, at least 2/day which is typical for her.  She has tried Pepcid over-the-counter but it did not help.  Left nipple pain-breast-fed her daughter until she was approximately 71 months old.  At that point, her daughter bit her left nipple hard causing some subsequent issues.  She has had pain at the spot and has a small area that produces thick white discharge when squeezed.  This was evaluated by a mammogram as well as ultrasound and she was told it was fine.  Unfortunately, the area has begun to hurt and she describes it as a deep ache.  She does not mess with it on a regular basis and tries to leave it alone.  Has not found anything that seems to resolve the issue.  Was never treated with antibiotics at the time of injury.  Mood follow-up-taking Wellbutrin 100 mg twice daily as prescribed, tolerating well without side effects.  Does note that she is at a rough point in therapy and feels like the medication is working okay for the most part.  She does do therapy weekly and thinks that she may just be at a point where it is going to get tough for little while.  Would like to avoid making changes to her medications at this time.  I reviewed the past medical history, family history, social history, surgical history, and allergies today and no changes were needed.  Please  see the problem list section below in epic for further details.   Physical exam:   General: Well Developed, well nourished, and in no acute distress.  Neuro: Alert and oriented x3.  HEENT: Normocephalic, atraumatic  Skin: Warm and dry.  Small pinpoint white dot to the 11:00 location on the left nipple, no induration, erythema, or fluctuance noted.  No pain or tenderness reproducible on palpation. Cardiac: Regular rate and rhythm, no murmurs rubs or gallops, no lower extremity edema.  Respiratory: Clear to auscultation bilaterally. Not using accessory muscles, speaking in full sentences.  Impression and Recommendations:    1. Breast pain, left With discharge, it is suspicious for a clogged milk duct versus cyst.  Since mammogram and ultrasound were not diagnostic, getting MRI of the left breast without contrast for further evaluation. - MR BREAST LEFT WO CONTRAST; Future  2. Gastroesophageal reflux disease without esophagitis Information regarding foods to avoid provided with AVS.  Starting Protonix 40 mg daily. - pantoprazole (PROTONIX) 40 MG tablet; Take 1 tablet (40 mg total) by mouth daily.  Dispense: 30 tablet; Refill: 3  3. Anxiety 4. Depression, unspecified depression type Continue Wellbutrin 100 mg twice daily as prescribed. - buPROPion (WELLBUTRIN) 100 MG tablet; Take 1 tablet (100 mg total) by mouth 2 (two) times daily.  Dispense: 180 tablet; Refill: 0  Return in about 4 weeks (around 10/20/2021) for gerd follow up. ___________________________________________ Ander Slade L.  Charna Archer, DNP, APRN, FNP-BC [facility]

## 2021-10-20 ENCOUNTER — Telehealth: Payer: 59 | Admitting: Physician Assistant

## 2021-10-20 DIAGNOSIS — J069 Acute upper respiratory infection, unspecified: Secondary | ICD-10-CM

## 2021-10-20 MED ORDER — FLUTICASONE PROPIONATE 50 MCG/ACT NA SUSP: 2.0000 | Freq: Every day | NASAL | 0 refills | Status: DC

## 2021-10-20 MED ORDER — CHLORASEPTIC GARGLE 1.4 % MT LIQD: 1.0000 | OROMUCOSAL | 0 refills | Status: DC | PRN

## 2021-10-20 MED ORDER — GUAIFENESIN ER 600 MG PO TB12: 600.0000 mg | ORAL_TABLET | Freq: Two times a day (BID) | ORAL | 0 refills | Status: DC

## 2021-10-20 NOTE — Progress Notes (Signed)
Ms. Martha Ware, Martha Ware are scheduled for a virtual visit with your provider today.    Just as we do with appointments in the office, we must obtain your consent to participate.  Your consent will be active for this visit and any virtual visit you may have with one of our providers in the next 365 days.    If you have a MyChart account, I can also send a copy of this consent to you electronically.  All virtual visits are billed to your insurance company just like a traditional visit in the office.  As this is a virtual visit, video technology does not allow for your provider to perform a traditional examination.  This may limit your provider's ability to fully assess your condition.  If your provider identifies any concerns that need to be evaluated in person or the need to arrange testing such as labs, EKG, etc, we will make arrangements to do so.    Although advances in technology are sophisticated, we cannot ensure that it will always work on either your end or our end.  If the connection with a video visit is poor, we may have to switch to a telephone visit.  With either a video or telephone visit, we are not always able to ensure that we have a secure connection.   I need to obtain your verbal consent now.   Are you willing to proceed with your visit today?   Martha Ware has provided verbal consent on 10/20/2021 for a virtual visit (video or telephone).   Martha Meres, PA-C 10/20/2021  10:16 AM   Date:  10/20/2021   ID:  Martha Ware, DOB [birth year], MRN [id number]  Patient Location: Home Provider Location: Home Office   Participants: Patient and Provider for Visit and Wrap up  Method of visit: Video  Location of Patient: Home Location of Provider: Home Office Consent was obtain for visit over the video. Services rendered by provider: Visit was performed via video  A video enabled telemedicine application was used and I verified that I am speaking with the correct person using two  identifiers.  PCP:  Martha Butter, NP   Chief Complaint:  sore throat  History of Present Illness:    Martha Ware is a 40 y.o. female with history as stated below. Presents video telehealth for an acute care visit  Pt reports she had a uri earlier this month about 1-2 weeks ago. States she had been improving last week but this week her symptoms returned. She is mainly complaining of a sore throat.   She reports congestion but does not think that she has had post nasal drip. She does not have tonsils. Pain is diffuse and she is able to swallow and is tolerating secretions. She denies fever.  Past Medical, Surgical, Social History, Allergies, and Medications have been Reviewed.  Past Medical History:  Diagnosis Date   Anxiety    Asthma    Depression    Hx of abnormal cervical Pap smear 2006   Hx of cholecystectomy     No outpatient medications have been marked as taking for the 10/20/21 encounter (Appointment) with Samson Frederic, Merwin Breden S, PA-C.     Allergies:   Codeine   ROS See HPI for history of present illness.  Physical Exam Vitals and nursing note reviewed.  Constitutional:      General: She is not in acute distress.    Appearance: She is well-developed.  HENT:     Head: Normocephalic.  Mouth/Throat:     Comments: Difficult to visualize posterior oropharynx given quality of video but pt is tolerating secretions, has no trisumus, and has normal phonation. Eyes:     Conjunctiva/sclera: Conjunctivae normal.  Cardiovascular:     Rate and Rhythm: Normal rate.  Pulmonary:     Effort: Pulmonary effort is normal.  Musculoskeletal:        General: Normal range of motion.     Cervical back: Neck supple.  Skin:    General: Skin is warm and dry.  Neurological:     Mental Status: She is alert.             MDM: pt with sore throat and uri sxs. Sore throat likely from post nasal drip. Have given rx for fluticasone, mucinex, chloraseptic. Advised on symptomatic tx at home  and plan for f/u. Low suspicion for strep or pta/rpa at this time.   There are no diagnoses linked to this encounter.   Time:   Today, I have spent 15 minutes with the patient with telehealth technology discussing the above problems, reviewing the chart, previous notes, medications and orders.    Tests Ordered: No orders of the defined types were placed in this encounter.   Medication Changes: No orders of the defined types were placed in this encounter.    Disposition:  Follow up  Signed, Martha Meres, PA-C  10/20/2021 10:16 AM

## 2021-10-20 NOTE — Patient Instructions (Signed)
Pollyann Samples, thank you for joining Karrie Meres, PA-C for today's virtual visit.  While this provider is not your primary care provider (PCP), if your PCP is located in our provider database this encounter information will be shared with them immediately following your visit.  Consent: (Patient) Martha Ware provided verbal consent for this virtual visit at the beginning of the encounter.  Current Medications:  Current Outpatient Medications:    fluticasone (FLONASE) 50 MCG/ACT nasal spray, Place 2 sprays into both nostrils daily., Disp: 16 g, Rfl: 0   guaiFENesin (MUCINEX) 600 MG 12 hr tablet, Take 1 tablet (600 mg total) by mouth 2 (two) times daily., Disp: 6 tablet, Rfl: 0   phenol (CHLORASEPTIC GARGLE) 1.4 % LIQD, Use as directed 1 spray in the mouth or throat as needed for throat irritation / pain., Disp: 118 mL, Rfl: 0   albuterol (VENTOLIN HFA) 108 (90 Base) MCG/ACT inhaler, Inhale 2 puffs into the lungs every 6 (six) hours as needed. , Disp: , Rfl:    b complex vitamins capsule, Take 1 capsule by mouth daily., Disp: , Rfl:    buPROPion (WELLBUTRIN) 100 MG tablet, Take 1 tablet (100 mg total) by mouth 2 (two) times daily., Disp: 180 tablet, Rfl: 0   Cholecalciferol (VITAMIN D-3) 125 MCG (5000 UT) TABS, Take 1 tablet by mouth daily., Disp: , Rfl:    cloNIDine (CATAPRES) 0.1 MG tablet, Take 0.1 mg by mouth daily as needed., Disp: , Rfl:    fexofenadine (ALLEGRA) 180 MG tablet, Take 180 mg by mouth daily., Disp: , Rfl:    montelukast (SINGULAIR) 10 MG tablet, Take 10 mg by mouth daily., Disp: , Rfl:    Multiple Vitamin tablet, Take 1 tablet by mouth daily., Disp: , Rfl:    Omega-3 Fatty Acids (FISH OIL) 1000 MG CAPS, Take 1 capsule by mouth daily., Disp: , Rfl:    pantoprazole (PROTONIX) 40 MG tablet, Take 1 tablet (40 mg total) by mouth daily., Disp: 30 tablet, Rfl: 3   Medications ordered in this encounter:  Meds ordered this encounter  Medications   fluticasone (FLONASE) 50  MCG/ACT nasal spray    Sig: Place 2 sprays into both nostrils daily.    Dispense:  16 g    Refill:  0    Order Specific Question:   Supervising Provider    Answer:   MILLER, BRIAN [[id number]]   guaiFENesin (MUCINEX) 600 MG 12 hr tablet    Sig: Take 1 tablet (600 mg total) by mouth 2 (two) times daily.    Dispense:  6 tablet    Refill:  0    Order Specific Question:   Supervising Provider    Answer:   MILLER, BRIAN [[id number]]   phenol (CHLORASEPTIC GARGLE) 1.4 % LIQD    Sig: Use as directed 1 spray in the mouth or throat as needed for throat irritation / pain.    Dispense:  118 mL    Refill:  0    Order Specific Question:   Supervising Provider    Answer:   Hyacinth Meeker, BRIAN [[id number]]     *If you need refills on other medications prior to your next appointment, please contact your pharmacy*  Follow-Up: Call back or seek an in-person evaluation if the symptoms worsen or if the condition fails to improve as anticipated.  Other Instructions Take prescriptions as directed. Stay well hydrated. Rotate tylenol and motrin for pain.   Please follow up with your primary care provider within 5-7 days for  re-evaluation of your symptoms. Please seek care in person for any new or worsening symptoms.  If you have been instructed to have an in-person evaluation today at a local Urgent Care facility, please use the link below. It will take you to a list of all of our available [facility], including address, phone number and hours of operation. Please do not delay care.  [facility]  If you or a family member do not have a primary care provider, use the link below to schedule a visit and establish care. When you choose a [facility] primary care physician or advanced practice provider, you gain a long-term partner in health. Find a Primary Care Provider  Learn more about [facility]'s in-office and virtual care options: [facility] Get Care Now

## 2021-10-22 ENCOUNTER — Emergency Department
Admission: EM | Admit: 2021-10-22 | Discharge: 2021-10-22 | Disposition: A | Discharge status: Home / Self Care | Payer: 59 | Source: Home / Self Care | Attending: Family Medicine | Admitting: Family Medicine

## 2021-10-22 ENCOUNTER — Other Ambulatory Visit: Payer: Self-pay

## 2021-10-22 ENCOUNTER — Encounter: Payer: Self-pay | Admitting: *Deleted

## 2021-10-22 ENCOUNTER — Ambulatory Visit: Payer: Self-pay

## 2021-10-22 DIAGNOSIS — B349 Viral infection, unspecified: Secondary | ICD-10-CM

## 2021-10-22 DIAGNOSIS — J029 Acute pharyngitis, unspecified: Secondary | ICD-10-CM | POA: Diagnosis not present

## 2021-10-22 LAB — POCT RAPID STREP A (OFFICE): Rapid Strep A Screen: NEGATIVE

## 2021-10-22 MED ORDER — HYDROCODONE-ACETAMINOPHEN 5-325 MG PO TABS: 1.0000 | ORAL_TABLET | Freq: Four times a day (QID) | ORAL | 0 refills | Status: DC | PRN

## 2021-10-22 MED ORDER — AMOXICILLIN 875 MG PO TABS: 875.0000 mg | ORAL_TABLET | Freq: Two times a day (BID) | ORAL | 0 refills | Status: DC

## 2021-10-22 NOTE — ED Triage Notes (Signed)
Pt c/o nasal congestion, sore throat, swollen lymph nodes x 10/09/21. Taking mucinex and nasal spray rx from E visit.

## 2021-10-22 NOTE — ED Provider Notes (Signed)
[facility]    CSN: [id number] Arrival date & time: 10/22/21  0949      History   Chief Complaint Chief Complaint  Patient presents with   Cough    HPI Martha Ware is a 40 y.o. female.   HPI Patient's been sick for 12 to 13 days.  Started off when her daughter had RSV.  She states that she felt like she may have RSV as well.  No one else at home is sick.  She states she has had some nasal congestion, postnasal drip, coughing.  She states that her symptoms have persisted during this time but no fever.  She states for the last couple of days she has progressively worsening painful sore throat.  Is COVID vaccinated.  Past Medical History:  Diagnosis Date   Anxiety    Asthma    Depression    Hx of abnormal cervical Pap smear 2006   Hx of cholecystectomy     Patient Active Problem List   Diagnosis Date Noted   Anxiety 10/01/2020   Depression 10/01/2020   Gastroesophageal reflux disease without esophagitis 10/01/2020   Decreased libido 10/01/2020    Past Surgical History:  Procedure Laterality Date   CHOLECYSTECTOMY     COLPOSCOPY W/ BIOPSY / CURETTAGE  2006   LYMPHADENECTOMY     TONSILLECTOMY      OB History   No obstetric history on file.      Home Medications    Prior to Admission medications   Medication Sig Start Date End Date Taking? Authorizing Provider  amoxicillin (AMOXIL) 875 MG tablet Take 1 tablet (875 mg total) by mouth 2 (two) times daily. 10/22/21  Yes Eustace Moore, MD  HYDROcodone-acetaminophen (NORCO/VICODIN) 5-325 MG tablet Take 1-2 tablets by mouth every 6 (six) hours as needed. 10/22/21  Yes Eustace Moore, MD  albuterol (VENTOLIN HFA) 108 (90 Base) MCG/ACT inhaler Inhale 2 puffs into the lungs every 6 (six) hours as needed.  09/20/20   [provider]  b complex vitamins capsule Take 1 capsule by mouth daily.    [provider]  buPROPion (WELLBUTRIN) 100 MG tablet Take 1 tablet (100 mg total) by mouth 2  (two) times daily. 09/22/21   Christen Butter, NP  Cholecalciferol (VITAMIN D-3) 125 MCG (5000 UT) TABS Take 1 tablet by mouth daily.    [provider]  cloNIDine (CATAPRES) 0.1 MG tablet Take 0.1 mg by mouth daily as needed.    [provider]  fexofenadine (ALLEGRA) 180 MG tablet Take 180 mg by mouth daily.    [provider]  fluticasone (FLONASE) 50 MCG/ACT nasal spray Place 2 sprays into both nostrils daily. 10/20/21   Couture, Cortni S, PA-C  guaiFENesin (MUCINEX) 600 MG 12 hr tablet Take 1 tablet (600 mg total) by mouth 2 (two) times daily. 10/20/21   Couture, Cortni S, PA-C  Multiple Vitamin tablet Take 1 tablet by mouth daily.    [provider]  Omega-3 Fatty Acids (FISH OIL) 1000 MG CAPS Take 1 capsule by mouth daily.    [provider]  pantoprazole (PROTONIX) 40 MG tablet Take 1 tablet (40 mg total) by mouth daily. 09/22/21   Christen Butter, NP  phenol (CHLORASEPTIC GARGLE) 1.4 % LIQD Use as directed 1 spray in the mouth or throat as needed for throat irritation / pain. 10/20/21   Couture, Cortni S, PA-C    Family History Family History  Problem Relation Age of Onset   Hypertension  Mother    Endometrial cancer Mother    Healthy Father    Hypothyroidism Maternal Grandmother    Alcohol abuse Maternal Grandfather     Social History Social History   Tobacco Use   Smoking status: Every Day    Types: E-cigarettes   Smokeless tobacco: Never  Vaping Use   Vaping Use: Every day  Substance Use Topics   Alcohol use: Yes    Alcohol/week: 10.0 standard drinks    Types: 10 Standard drinks or equivalent per week   Drug use: Yes    Frequency: 4.0 times per week    Types: Marijuana     Allergies   Codeine   Review of Systems Review of Systems See HPI  Physical Exam Triage Vital Signs ED Triage Vitals  Enc Vitals Group     BP 10/22/21 1038 117/81     Pulse Rate 10/22/21 1038 76     Resp 10/22/21 1038 18     Temp 10/22/21 1038  98.7 F (37.1 C)     Temp Source 10/22/21 1038 Oral     SpO2 10/22/21 1038 (!) 76 % rechecked     Weight 10/22/21 1035 240 lb (108.9 kg)     Height 10/22/21 1035 5\' 8"  (1.727 m)     Head Circumference --      Peak Flow --      Pain Score 10/22/21 1035 5     Pain Loc --      Pain Edu? --      Excl. in GC? --    No data found.  Updated Vital Signs BP 117/81 (BP Location: Right Arm)   Pulse 76   Temp 98.7 F (37.1 C) (Oral)   Resp 18   Ht 5\' 8"  (1.727 m)   Wt 108.9 kg   LMP 10/01/2021   SpO2 (!) 76%   BMI 36.49 kg/m      Physical Exam Constitutional:      General: She is not in acute distress.    Appearance: She is well-developed. She is obese.  HENT:     Head: Normocephalic and atraumatic.     Right Ear: Tympanic membrane, ear canal and external ear normal.     Left Ear: Tympanic membrane, ear canal and external ear normal.     Nose: Congestion and rhinorrhea present.     Mouth/Throat:     Pharynx: Posterior oropharyngeal erythema present.     Comments: Posterior pharynx is deeply erythematous Eyes:     Conjunctiva/sclera: Conjunctivae normal.     Pupils: Pupils are equal, round, and reactive to light.  Cardiovascular:     Rate and Rhythm: Normal rate and regular rhythm.     Heart sounds: Normal heart sounds.  Pulmonary:     Effort: Pulmonary effort is normal. No respiratory distress.     Breath sounds: Normal breath sounds. No wheezing or rhonchi.  Abdominal:     General: There is no distension.     Palpations: Abdomen is soft.  Musculoskeletal:        General: Normal range of motion.     Cervical back: Normal range of motion and neck supple.  Lymphadenopathy:     Cervical: Cervical adenopathy present.  Skin:    General: Skin is warm and dry.  Neurological:     Mental Status: She is alert.  Psychiatric:        Mood and Affect: Mood normal.        Behavior: Behavior normal.  UC Treatments / Results  Labs (all labs ordered are listed, but only  abnormal results are displayed) Labs Reviewed  CULTURE, GROUP A STREP  POCT RAPID STREP A (OFFICE)    EKG   Radiology No results found.  Procedures Procedures (including critical care time)  Medications Ordered in UC Medications - No data to display  Initial Impression / Assessment and Plan / UC Course  I have reviewed the triage vital signs and the nursing notes.  Pertinent labs & imaging results that were available during my care of the patient were reviewed by me and considered in my medical decision making (see chart for details).     Patient desires strep testing to make sure she does not have something she will give to her family.  The strep test is negative.  Culture is sent.  Because she has had viral symptoms for over 10 days I am going to treat her with an antibiotic and hope this helps.  Patient desires medication to help with her throat pain.  States she can hardly swallow.  We will give her a limited number of hydrocodone Final Clinical Impressions(s) / UC Diagnoses   Final diagnoses:  Sore throat  Viral illness     Discharge Instructions      I have prescribed hydrocodone for the pain.  Make sure you take this with food.  Do not drive on hydrocodone Take antibiotic 2 times a day Drink lots of water May continue Mucinex and Flonase for your symptoms Call or return if not better in a few days   ED Prescriptions     Medication Sig Dispense Auth. Provider   amoxicillin (AMOXIL) 875 MG tablet Take 1 tablet (875 mg total) by mouth 2 (two) times daily. 14 tablet Eustace Moore, MD   HYDROcodone-acetaminophen (NORCO/VICODIN) 5-325 MG tablet Take 1-2 tablets by mouth every 6 (six) hours as needed. 12 tablet Eustace Moore, MD      I have reviewed the PDMP during this encounter.   Eustace Moore, MD 10/22/21 [phone]

## 2021-10-22 NOTE — Discharge Instructions (Signed)
I have prescribed hydrocodone for the pain.  Make sure you take this with food.  Do not drive on hydrocodone Take antibiotic 2 times a day Drink lots of water May continue Mucinex and Flonase for your symptoms Call or return if not better in a few days

## 2021-10-25 LAB — CULTURE, GROUP A STREP: Strep A Culture: NEGATIVE

## 2021-10-31 ENCOUNTER — Encounter: Payer: 59 | Admitting: Medical-Surgical

## 2021-12-23 ENCOUNTER — Encounter: Payer: Self-pay | Admitting: Medical-Surgical

## 2021-12-23 DIAGNOSIS — N644 Mastodynia: Secondary | ICD-10-CM

## 2021-12-25 ENCOUNTER — Other Ambulatory Visit: Payer: Self-pay | Admitting: Medical-Surgical

## 2021-12-25 DIAGNOSIS — N644 Mastodynia: Secondary | ICD-10-CM

## 2021-12-29 ENCOUNTER — Other Ambulatory Visit: Payer: Self-pay | Admitting: Ophthalmology

## 2021-12-29 DIAGNOSIS — H539 Unspecified visual disturbance: Secondary | ICD-10-CM

## 2021-12-31 ENCOUNTER — Encounter: Payer: Self-pay | Admitting: Medical-Surgical

## 2021-12-31 ENCOUNTER — Other Ambulatory Visit: Payer: Self-pay

## 2021-12-31 ENCOUNTER — Ambulatory Visit (INDEPENDENT_AMBULATORY_CARE_PROVIDER_SITE_OTHER): Payer: 59 | Admitting: Medical-Surgical

## 2021-12-31 VITALS — BP 112/80 | HR 77 | Resp 20 | Ht 68.0 in | Wt 239.0 lb

## 2021-12-31 DIAGNOSIS — Z124 Encounter for screening for malignant neoplasm of cervix: Secondary | ICD-10-CM | POA: Diagnosis not present

## 2021-12-31 DIAGNOSIS — N926 Irregular menstruation, unspecified: Secondary | ICD-10-CM

## 2021-12-31 DIAGNOSIS — Z1329 Encounter for screening for other suspected endocrine disorder: Secondary | ICD-10-CM

## 2021-12-31 DIAGNOSIS — F32A Depression, unspecified: Secondary | ICD-10-CM

## 2021-12-31 DIAGNOSIS — Z131 Encounter for screening for diabetes mellitus: Secondary | ICD-10-CM | POA: Diagnosis not present

## 2021-12-31 DIAGNOSIS — R6882 Decreased libido: Secondary | ICD-10-CM

## 2021-12-31 DIAGNOSIS — Z0000 Encounter for general adult medical examination without abnormal findings: Secondary | ICD-10-CM

## 2021-12-31 DIAGNOSIS — R5383 Other fatigue: Secondary | ICD-10-CM

## 2021-12-31 DIAGNOSIS — F419 Anxiety disorder, unspecified: Secondary | ICD-10-CM

## 2021-12-31 DIAGNOSIS — K219 Gastro-esophageal reflux disease without esophagitis: Secondary | ICD-10-CM

## 2021-12-31 MED ORDER — PANTOPRAZOLE SODIUM 40 MG PO TBEC: 40.0000 mg | DELAYED_RELEASE_TABLET | Freq: Every day | ORAL | 1 refills | Status: DC

## 2021-12-31 MED ORDER — BUPROPION HCL ER (XL) 300 MG PO TB24: 300.0000 mg | ORAL_TABLET | Freq: Every day | ORAL | 1 refills | Status: DC

## 2021-12-31 NOTE — Patient Instructions (Signed)
Preventive Care 40-40 Years Old, Female °Preventive care refers to lifestyle choices and visits with your health care provider that can promote health and wellness. Preventive care visits are also called wellness exams. °What can I expect for my preventive care visit? °Counseling °During your preventive care visit, your health care provider may ask about your: °Medical history, including: °Past medical problems. °Family medical history. °Pregnancy history. °Current health, including: °Menstrual cycle. °Method of birth control. °Emotional well-being. °Home life and relationship well-being. °Sexual activity and sexual health. °Lifestyle, including: °Alcohol, nicotine or tobacco, and drug use. °Access to firearms. °Diet, exercise, and sleep habits. °Work and work environment. °Sunscreen use. °Safety issues such as seatbelt and bike helmet use. °Physical exam °Your health care provider may check your: °Height and weight. These may be used to calculate your BMI (body mass index). BMI is a measurement that tells if you are at a healthy weight. °Waist circumference. This measures the distance around your waistline. This measurement also tells if you are at a healthy weight and may help predict your risk of certain diseases, such as type 2 diabetes and high blood pressure. °Heart rate and blood pressure. °Body temperature. °Skin for abnormal spots. °What immunizations do I need? °Vaccines are usually given at various ages, according to a schedule. Your health care provider will recommend vaccines for you based on your age, medical history, and lifestyle or other factors, such as travel or where you work. °What tests do I need? °Screening °Your health care provider may recommend screening tests for certain conditions. This may include: °Pelvic exam and Pap test. °Lipid and cholesterol levels. °Diabetes screening. This is done by checking your blood sugar (glucose) after you have not eaten for a while (fasting). °Hepatitis B  test. °Hepatitis C test. °HIV (human immunodeficiency virus) test. °STI (sexually transmitted infection) testing, if you are at risk. °BRCA-related cancer screening. This may be done if you have a family history of breast, ovarian, tubal, or peritoneal cancers. °Talk with your health care provider about your test results, treatment options, and if necessary, the need for more tests. °Follow these instructions at home: °Eating and drinking ° °Eat a healthy diet that includes fresh fruits and vegetables, whole grains, lean protein, and low-fat dairy products. °Take vitamin and mineral supplements as recommended by your health care provider. °Do not drink alcohol if: °Your health care provider tells you not to drink. °You are pregnant, may be pregnant, or are planning to become pregnant. °If you drink alcohol: °Limit how much you have to 0-1 drink a day. °Know how much alcohol is in your drink. In the [place] one drink equals one 12 oz bottle of beer (355 mL), one 5 oz glass of wine (148 mL), or one 1½ oz glass of hard liquor (44 mL). °Lifestyle °Brush your teeth every morning and night with fluoride toothpaste. Floss one time each day. °Exercise for at least 30 minutes 5 or more days each week. °Do not use any products that contain nicotine or tobacco. These products include cigarettes, chewing tobacco, and vaping devices, such as e-cigarettes. If you need help quitting, ask your health care provider. °Do not use drugs. °If you are sexually active, practice safe sex. Use a condom or other form of protection to prevent STIs. °If you do not wish to become pregnant, use a form of birth control. If you plan to become pregnant, see your health care provider for a prepregnancy visit. °Find healthy ways to manage stress, such as: °Meditation, yoga,   or listening to music. °Journaling. °Talking to a trusted person. °Spending time with friends and family. °Minimize exposure to UV radiation to reduce your risk of skin  cancer. °Safety °Always wear your seat belt while driving or riding in a vehicle. °Do not drive: °If you have been drinking alcohol. Do not ride with someone who has been drinking. °If you have been using any mind-altering substances or drugs. °While texting. °When you are tired or distracted. °Wear a helmet and other protective equipment during sports activities. °If you have firearms in your house, make sure you follow all gun safety procedures. °Seek help if you have been physically or sexually abused. °What's next? °Go to your health care provider once a year for an annual wellness visit. °Ask your health care provider how often you should have your eyes and teeth checked. °Stay up to date on all vaccines. °This information is not intended to replace advice given to you by your health care provider. Make sure you discuss any questions you have with your health care provider. °Document Revised: 06/04/2021 Document Reviewed: 06/04/2021 °[employer] Patient Education © 2022 [employer] ° °

## 2021-12-31 NOTE — Progress Notes (Signed)
HPI: Martha Ware is a 41 y.o. female who  has a past medical history of Allergy, Anxiety, Asthma, Depression, GERD (gastroesophageal reflux disease), abnormal cervical Pap smear (2006), and cholecystectomy.  she presents to [facility] today, 12/31/21,  for chief complaint of: Annual physical exam  Dentist: UTD, having 2 cavities fixed soon Eye exam: UTD, new glasses prescription Exercise: none intentional Diet: no restrictions Pap smear: normal in 2019 COVID vaccine: UTD  Concerns: Menstrual changes, mood swings, interested in hormone testing to determine if this is related to perimenopausal symptoms. Taking Wellbutrin 161m twice daily but isn't sure if it's working well anymore.   Past medical, surgical, social and family history reviewed:  Patient Active Problem List   Diagnosis Date Noted   Anxiety 10/01/2020   Depression 10/01/2020   Gastroesophageal reflux disease without esophagitis 10/01/2020   Decreased libido 10/01/2020    Past Surgical History:  Procedure Laterality Date   CHOLECYSTECTOMY     COLPOSCOPY W/ BIOPSY / CURETTAGE  2006   LYMPHADENECTOMY     TONSILLECTOMY      Social History   Tobacco Use   Smoking status: Every Day    Types: E-cigarettes   Smokeless tobacco: Never  Substance Use Topics   Alcohol use: Yes    Alcohol/week: 6.0 standard drinks    Types: 6 Glasses of wine per week    Family History  Problem Relation Age of Onset   Hypertension Mother    Endometrial cancer Mother    Healthy Father    Hypothyroidism Maternal Grandmother    Alcohol abuse Maternal Grandfather      Current medication list and allergy/intolerance information reviewed:    Current Outpatient Medications  Medication Sig Dispense Refill   albuterol (VENTOLIN HFA) 108 (90 Base) MCG/ACT inhaler Inhale 2 puffs into the lungs every 6 (six) hours as needed.      b complex vitamins capsule Take 1 capsule by mouth daily.      buPROPion (WELLBUTRIN XL) 300 MG 24 hr tablet Take 1 tablet (300 mg total) by mouth daily. 90 tablet 1   Cholecalciferol (VITAMIN D-3) 125 MCG (5000 UT) TABS Take 1 tablet by mouth daily.     cloNIDine (CATAPRES) 0.1 MG tablet Take 0.1 mg by mouth daily as needed.     fexofenadine (ALLEGRA) 180 MG tablet Take 180 mg by mouth daily.     fluticasone (FLONASE) 50 MCG/ACT nasal spray Place 2 sprays into both nostrils daily. 16 g 0   Multiple Vitamin tablet Take 1 tablet by mouth daily.     Omega-3 Fatty Acids (FISH OIL) 1000 MG CAPS Take 1 capsule by mouth daily.     pantoprazole (PROTONIX) 40 MG tablet Take 1 tablet (40 mg total) by mouth daily. 90 tablet 1   No current facility-administered medications for this visit.    Allergies  Allergen Reactions   Codeine Nausea And Vomiting      Review of Systems: Constitutional:  No  fever, no chills, No recent illness, No unintentional weight changes. No significant fatigue.  HEENT: No  headache, no vision change, no hearing change, No sore throat, No  sinus pressure Cardiac: No  chest pain, No  pressure, No palpitations, No  Orthopnea Respiratory:  No  shortness of breath. No  Cough Gastrointestinal: No  abdominal pain, No  nausea, No  vomiting,  No  blood in stool, No  diarrhea, No  constipation  Musculoskeletal: No new myalgia/arthralgia Skin: No  Rash, No  other wounds/concerning lesions Genitourinary: No  incontinence, No  abnormal genital bleeding, No abnormal genital discharge, + menstrual irregularity Hem/Onc: No  easy bruising/bleeding, No  abnormal lymph node Endocrine: No cold intolerance,  No heat intolerance. No polyuria/polydipsia/polyphagia  Neurologic: No  weakness, No  dizziness, No  slurred speech/focal weakness/facial droop Psychiatric: + concerns with depression, + concerns with anxiety, + sleep problems, + mood problems  Exam:  BP 112/80 (BP Location: Left Arm, Patient Position: Sitting, Cuff Size: Normal)    Pulse 77     Resp 20    Ht 5' 8"  (1.727 m)    Wt 239 lb (108.4 kg)    SpO2 100%    BMI 36.34 kg/m  Constitutional: VS see above. General Appearance: alert, well-developed, well-nourished, NAD Eyes: Normal lids and conjunctive, non-icteric sclera Ears, Nose, Mouth, Throat: MMM, Normal external inspection ears/nares/mouth/lips/gums. TM normal bilaterally.  Neck: No masses, trachea midline. No thyroid enlargement. No tenderness/mass appreciated. No lymphadenopathy Respiratory: Normal respiratory effort. no wheeze, no rhonchi, no rales Cardiovascular: S1/S2 normal, no murmur, no rub/gallop auscultated. RRR. No lower extremity edema. Pedal pulse II/IV bilaterally  PT. No carotid bruit or JVD. No abdominal aortic bruit. Gastrointestinal: Nontender, no masses. No hepatomegaly, no splenomegaly. No hernia appreciated. Bowel sounds normal. Rectal exam deferred.  Musculoskeletal: Gait normal. No clubbing/cyanosis of digits.  Neurological: Normal balance/coordination. No tremor. No cranial nerve deficit on limited exam. Motor and sensation intact and symmetric. Cerebellar reflexes intact.  Skin: warm, dry, intact. No rash/ulcer. No concerning nevi or subq nodules on limited exam.  0.5cmx 0.3cm brown slightly raised, rough lesion on the RLQ abdomen without color irregularity, abnormal borders, or erythema. Psychiatric: Normal judgment/insight. Normal mood and affect. Oriented x3.    ASSESSMENT/PLAN:   1. Annual physical exam Checking labs as below. Wellness information provided with AVS.  - Lipid panel - COMPLETE METABOLIC PANEL WITH GFR - CBC with Differential/Platelet  2. Thyroid disorder screen Checking TSH. - TSH  3. Diabetes mellitus screening Checking A1c.  - Hemoglobin A1c  4. Cervical cancer screening UTD. Due next year.   5. Gastroesophageal reflux disease without esophagitis Continue Protonix daily prn.  - pantoprazole (PROTONIX) 40 MG tablet; Take 1 tablet (40 mg total) by mouth daily.   Dispense: 90 tablet; Refill: 1  6. Anxiety Increasing Wellbutrin to 314m XL.   7. Decreased libido Resolved.   8. Depression, unspecified depression type Increasing Wellbutrin as above.   9. Fatigue, unspecified type 10. Menstrual irregularity Checking labs as below. Possible relation to perimenopause but also has multiple other potential contributors such as depression, anxiety, parenting, etc.  - TSH - Hemoglobin A1c - Prolactin - FSH/LH - Estradiol - 17-Hydroxyprogesterone - Testosterone  Orders Placed This Encounter  Procedures   TSH   Lipid panel   COMPLETE METABOLIC PANEL WITH GFR   CBC with Differential/Platelet   Hemoglobin A1c   HM PAP SMEAR   Prolactin   FSH/LH   Estradiol   17-Hydroxyprogesterone   Testosterone    Meds ordered this encounter  Medications   buPROPion (WELLBUTRIN XL) 300 MG 24 hr tablet    Sig: Take 1 tablet (300 mg total) by mouth daily.    Dispense:  90 tablet    Refill:  1    Order Specific Question:   Supervising Provider    Answer:   MATTHEWS, CODY [[id number]]   pantoprazole (PROTONIX) 40 MG tablet    Sig: Take 1 tablet (40 mg total) by mouth daily.    Dispense:  90 tablet    Refill:  1    Order Specific Question:   Supervising Provider    Answer:   Estell Harpin    Patient Instructions  Preventive Care 82-14 Years Old, Female Preventive care refers to lifestyle choices and visits with your health care provider that can promote health and wellness. Preventive care visits are also called wellness exams. What can I expect for my preventive care visit? Counseling During your preventive care visit, your health care provider may ask about your: Medical history, including: Past medical problems. Family medical history. Pregnancy history. Current health, including: Menstrual cycle. Method of birth control. Emotional well-being. Home life and relationship well-being. Sexual activity and sexual health. Lifestyle,  including: Alcohol, nicotine or tobacco, and drug use. Access to firearms. Diet, exercise, and sleep habits. Work and work Statistician. Sunscreen use. Safety issues such as seatbelt and bike helmet use. Physical exam Your health care provider may check your: Height and weight. These may be used to calculate your BMI (body mass index). BMI is a measurement that tells if you are at a healthy weight. Waist circumference. This measures the distance around your waistline. This measurement also tells if you are at a healthy weight and may help predict your risk of certain diseases, such as type 2 diabetes and high blood pressure. Heart rate and blood pressure. Body temperature. Skin for abnormal spots. What immunizations do I need? Vaccines are usually given at various ages, according to a schedule. Your health care provider will recommend vaccines for you based on your age, medical history, and lifestyle or other factors, such as travel or where you work. What tests do I need? Screening Your health care provider may recommend screening tests for certain conditions. This may include: Pelvic exam and Pap test. Lipid and cholesterol levels. Diabetes screening. This is done by checking your blood sugar (glucose) after you have not eaten for a while (fasting). Hepatitis B test. Hepatitis C test. HIV (human immunodeficiency virus) test. STI (sexually transmitted infection) testing, if you are at risk. BRCA-related cancer screening. This may be done if you have a family history of breast, ovarian, tubal, or peritoneal cancers. Talk with your health care provider about your test results, treatment options, and if necessary, the need for more tests. Follow these instructions at home: Eating and drinking  Eat a healthy diet that includes fresh fruits and vegetables, whole grains, lean protein, and low-fat dairy products. Take vitamin and mineral supplements as recommended by your health care  provider. Do not drink alcohol if: Your health care provider tells you not to drink. You are pregnant, may be pregnant, or are planning to become pregnant. If you drink alcohol: Limit how much you have to 0-1 drink a day. Know how much alcohol is in your drink. In the [place] one drink equals one 12 oz bottle of beer (355 mL), one 5 oz glass of wine (148 mL), or one 1 oz glass of hard liquor (44 mL). Lifestyle Brush your teeth every morning and night with fluoride toothpaste. Floss one time each day. Exercise for at least 30 minutes 5 or more days each week. Do not use any products that contain nicotine or tobacco. These products include cigarettes, chewing tobacco, and vaping devices, such as e-cigarettes. If you need help quitting, ask your health care provider. Do not use drugs. If you are sexually active, practice safe sex. Use a condom or other form of protection to prevent STIs. If you do not wish  to become pregnant, use a form of birth control. If you plan to become pregnant, see your health care provider for a prepregnancy visit. Find healthy ways to manage stress, such as: Meditation, yoga, or listening to music. Journaling. Talking to a trusted person. Spending time with friends and family. Minimize exposure to UV radiation to reduce your risk of skin cancer. Safety Always wear your seat belt while driving or riding in a vehicle. Do not drive: If you have been drinking alcohol. Do not ride with someone who has been drinking. If you have been using any mind-altering substances or drugs. While texting. When you are tired or distracted. Wear a helmet and other protective equipment during sports activities. If you have firearms in your house, make sure you follow all gun safety procedures. Seek help if you have been physically or sexually abused. What's next? Go to your health care provider once a year for an annual wellness visit. Ask your health care provider how often you  should have your eyes and teeth checked. Stay up to date on all vaccines. This information is not intended to replace advice given to you by your health care provider. Make sure you discuss any questions you have with your health care provider. Document Revised: 06/04/2021 Document Reviewed: 06/04/2021 Elsevier Patient Education  [place].  Follow-up plan: Return in about 4 weeks (around 01/28/2022) for mood follow up (virtual is fine).  Clearnce Sorrel, DNP, APRN, FNP-BC [facility]

## 2022-01-05 LAB — COMPLETE METABOLIC PANEL WITH GFR
AG Ratio: 1.5 (calc) (ref 1.0–2.5)
ALT: 17 U/L (ref 6–29)
AST: 16 U/L (ref 10–30)
Albumin: 4.6 g/dL (ref 3.6–5.1)
Alkaline phosphatase (APISO): 54 U/L (ref 31–125)
BUN: 10 mg/dL (ref 7–25)
CO2: 30 mmol/L (ref 20–32)
Calcium: 9.5 mg/dL (ref 8.6–10.2)
Chloride: 102 mmol/L (ref 98–110)
Creat: 0.77 mg/dL (ref 0.50–0.97)
Globulin: 3 g/dL (calc) (ref 1.9–3.7)
Glucose, Bld: 92 mg/dL (ref 65–99)
Potassium: 4.6 mmol/L (ref 3.5–5.3)
Sodium: 137 mmol/L (ref 135–146)
Total Bilirubin: 0.6 mg/dL (ref 0.2–1.2)
Total Protein: 7.6 g/dL (ref 6.1–8.1)
eGFR: 101 mL/min/{1.73_m2} (ref >=60)

## 2022-01-05 LAB — CBC WITH DIFFERENTIAL/PLATELET
Absolute Monocytes: 433 cells/uL (ref 200–950)
Basophils Absolute: 38 cells/uL (ref 0–200)
Basophils Relative: 0.5 %
Eosinophils Absolute: 198 cells/uL (ref 15–500)
Eosinophils Relative: 2.6 %
HCT: 38.2 % (ref 35.0–45.0)
Hemoglobin: 12.6 g/dL (ref 11.7–15.5)
Lymphs Abs: 2113 cells/uL (ref 850–3900)
MCH: 29.2 pg (ref 27.0–33.0)
MCHC: 33 g/dL (ref 32.0–36.0)
MCV: 88.4 fL (ref 80.0–100.0)
MPV: 10.3 fL (ref 7.5–12.5)
Monocytes Relative: 5.7 %
Neutro Abs: 4818 cells/uL (ref 1500–7800)
Neutrophils Relative %: 63.4 %
Platelets: 371 10*3/uL (ref 140–400)
RBC: 4.32 10*6/uL (ref 3.80–5.10)
RDW: 13 % (ref 11.0–15.0)
Total Lymphocyte: 27.8 %
WBC: 7.6 10*3/uL (ref 3.8–10.8)

## 2022-01-05 LAB — HEMOGLOBIN A1C
Hgb A1c MFr Bld: 5.3 % of total Hgb (ref <5.7)
Mean Plasma Glucose: 105 mg/dL
eAG (mmol/L): 5.8 mmol/L

## 2022-01-05 LAB — LIPID PANEL
Cholesterol: 171 mg/dL (ref <200)
HDL: 56 mg/dL (ref >=50)
LDL Cholesterol (Calc): 96 mg/dL (calc)
Non-HDL Cholesterol (Calc): 115 mg/dL (calc) (ref <130)
Total CHOL/HDL Ratio: 3.1 (calc) (ref <5.0)
Triglycerides: 101 mg/dL (ref <150)

## 2022-01-05 LAB — FSH/LH
FSH: 8.7 m[IU]/mL
LH: 4.2 m[IU]/mL

## 2022-01-05 LAB — PROLACTIN: Prolactin: 4.4 ng/mL

## 2022-01-05 LAB — ESTRADIOL: Estradiol: 83 pg/mL

## 2022-01-05 LAB — TESTOSTERONE, TOTAL, LC/MS/MS: Testosterone, Total, LC-MS-MS: 23 ng/dL (ref 2–45)

## 2022-01-05 LAB — 17-HYDROXYPROGESTERONE: 17-OH-Progesterone, LC/MS/MS: 78 ng/dL

## 2022-01-05 LAB — TSH: TSH: 1.37 mIU/L

## 2022-01-11 ENCOUNTER — Other Ambulatory Visit: Payer: Self-pay

## 2022-01-11 ENCOUNTER — Ambulatory Visit
Admission: RE | Admit: 2022-01-11 | Discharge: 2022-01-11 | Disposition: A | Discharge status: Home / Self Care | Payer: 59 | Source: Ambulatory Visit | Attending: Ophthalmology | Admitting: Ophthalmology

## 2022-01-11 DIAGNOSIS — H539 Unspecified visual disturbance: Secondary | ICD-10-CM

## 2022-01-11 IMAGING — MR MR HEAD WO/W CM
12 series · 48 of 48 positions shown · IV contrast (multihance)
Comparison: None.

CLINICAL DATA: 40-year-old female with vision changes for several
years. Abnormal right optic nerve.

EXAM:
MRI HEAD WITHOUT AND WITH CONTRAST
TECHNIQUE: Multiplanar, multiecho pulse sequences of the brain and surrounding
structures were obtained without and with intravenous contrast.
CONTRAST:  20mL MULTIHANCE GADOBENATE DIMEGLUMINE 529 MG/ML IV SOLN

[Series 2: T1 · sagittal · 5.0mm · 0.45mm/px · 1 of 21 slices shown]
[im 1/21]
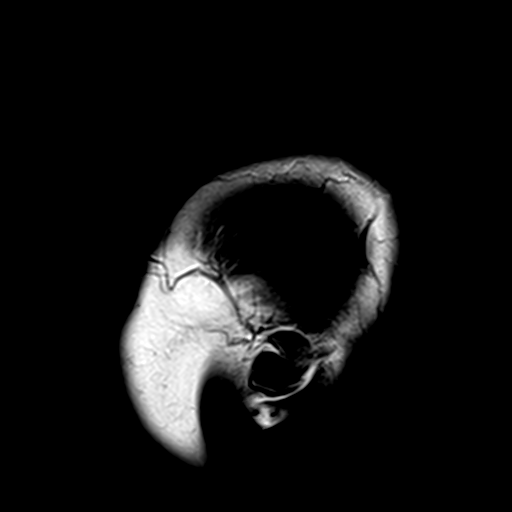

[Series 3: DWI · axial · 3.0mm · 1.80mm/px · z∈[-80,+66]mm · 7 of 100 slices shown (1 of 4)]
[im 1/100]
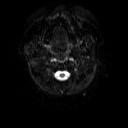
[im 17/100]
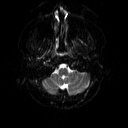
[im 34/100]
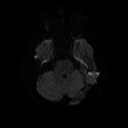
[im 50/100]
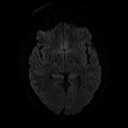
[im 67/100]
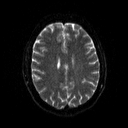
[im 83/100]
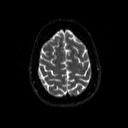
[im 100/100]
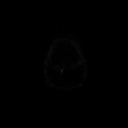

[Series 4: DWI · axial · 3.0mm · 1.80mm/px · z∈[-80,+66]mm · 3 of 47 slices shown (2 of 4)]
[im 1/47]
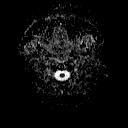
[im 24/47]
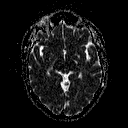
[im 47/47]
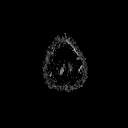

[Series 5: DWI · coronal · 5.0mm · 1.80mm/px · 5 of 67 slices shown (3 of 4)]
[im 1/67]
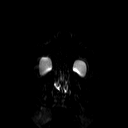
[im 17/67]
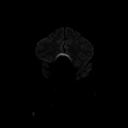
[im 34/67]
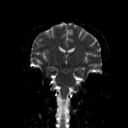
[im 50/67]
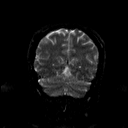
[im 67/67]
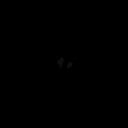

[Series 6: DWI · coronal · 5.0mm · 1.80mm/px · 2 of 34 slices shown (4 of 4)]
[im 1/34]
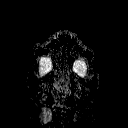
[im 34/34]
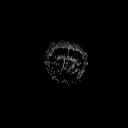

[Series 7: T2 · axial · 5.0mm · 0.60mm/px · z∈[-75,+65]mm · 2 of 22 slices shown (1 of 2)]
[im 1/22]
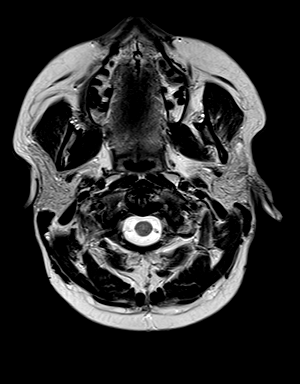
[im 22/22]
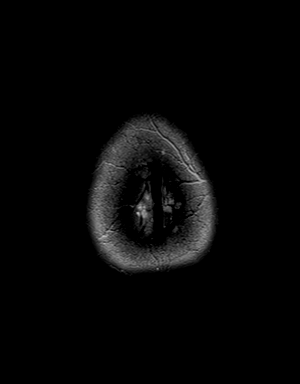

[Series 8: FLAIR · axial · 3.0mm · 0.45mm/px · z∈[-73,+70]mm · 2 of 32 slices shown]
[im 1/32]
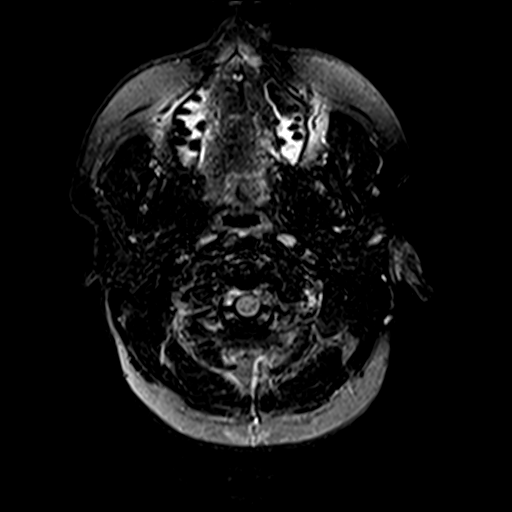
[im 32/32]
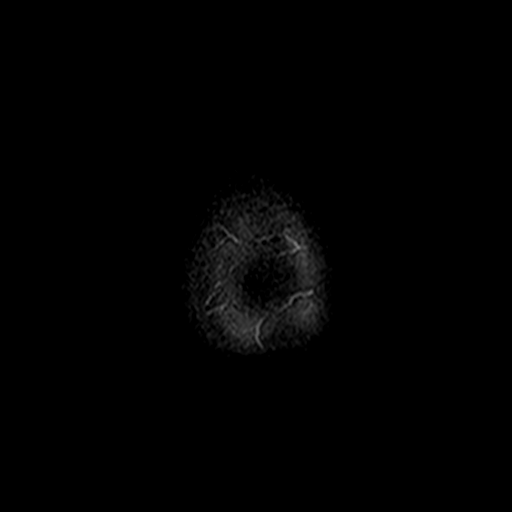

[Series 10: swi_images · axial · 4.0mm · 0.90mm/px · z∈[-76,+63]mm · 2 of 36 slices shown]
[im 1/36]
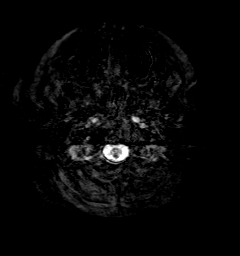
[im 36/36]
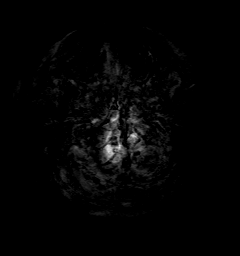

[Series 11: t1_mpr_tra · axial · 1.0mm · 0.75mm/px · z∈[-64,+76]mm · 10 of 144 slices shown]
[im 1/144]
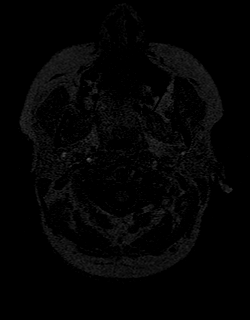
[im 16/144]
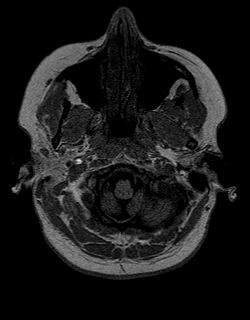
[im 32/144]
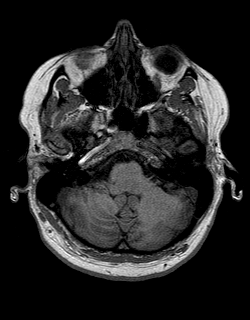
[im 48/144]
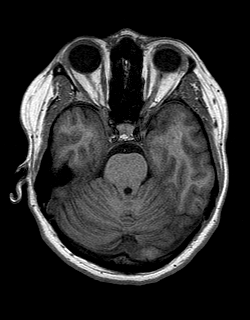
[im 64/144]
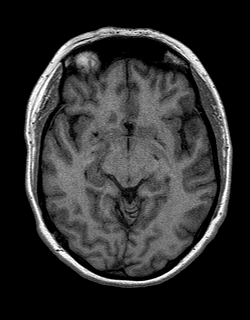
[im 80/144]
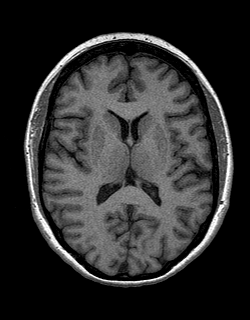
[im 96/144]
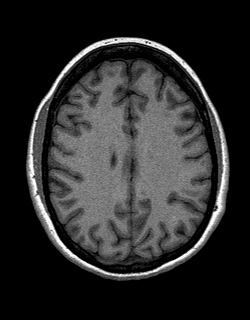
[im 112/144]
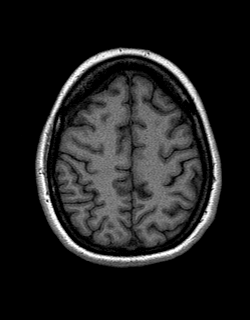
[im 128/144]
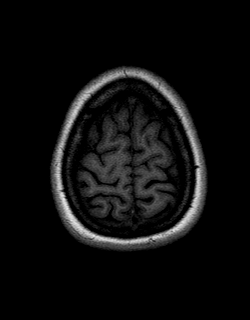
[im 144/144]
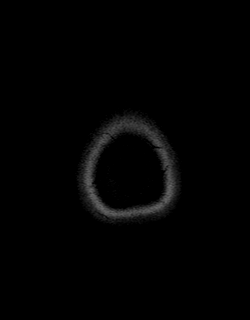

[Series 12: T2 · coronal · 5.0mm · 0.45mm/px · 2 of 27 slices shown (2 of 2)]
[im 1/27]
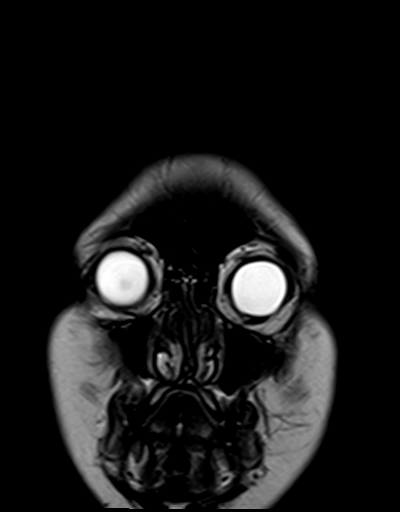
[im 27/27]
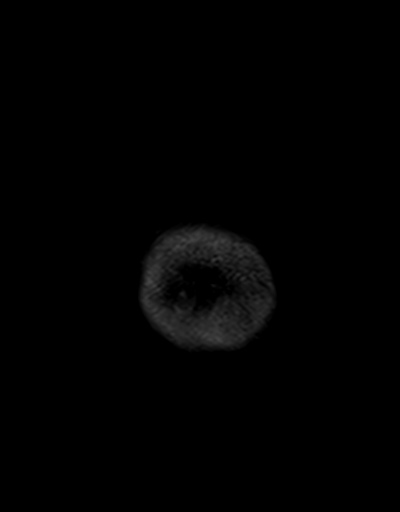

[Series 13: t1_mpr_tra post · axial · 1.0mm · 0.75mm/px · z∈[-64,+76]mm · 10 of 144 slices shown]
[im 1/144]
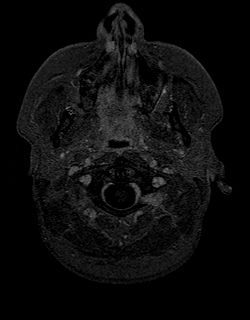
[im 16/144]
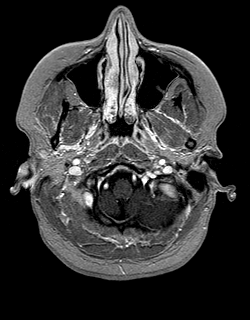
[im 32/144]
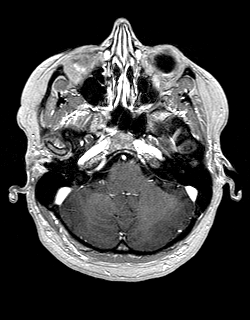
[im 48/144]
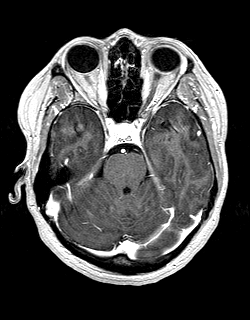
[im 64/144]
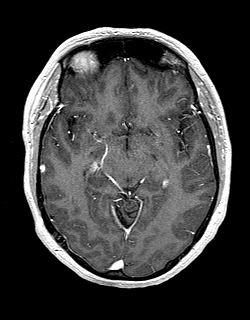
[im 80/144]
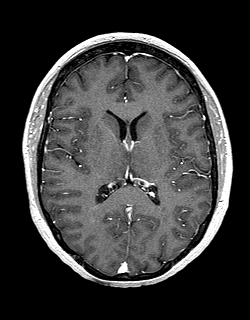
[im 96/144]
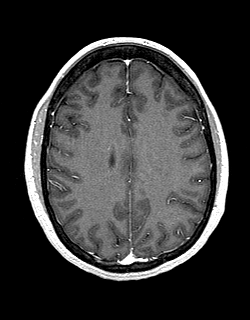
[im 112/144]
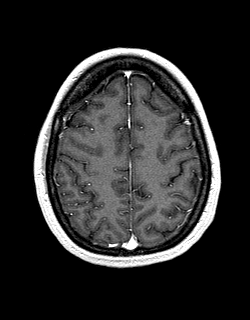
[im 128/144]
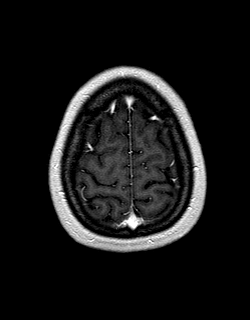
[im 144/144]
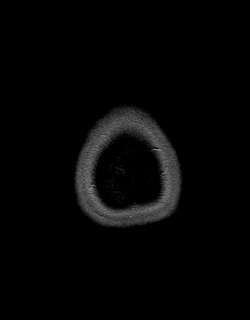

[Series 14: post cor · coronal · 5.0mm · 0.45mm/px · 2 of 27 slices shown]
[im 1/27]
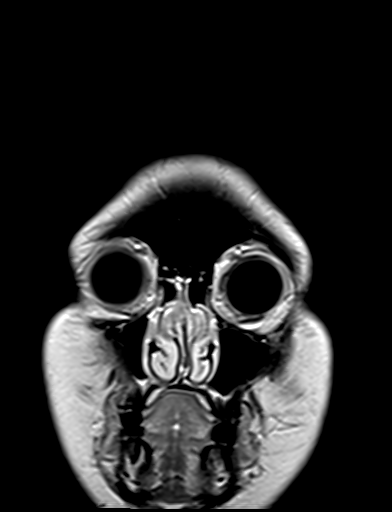
[im 27/27]
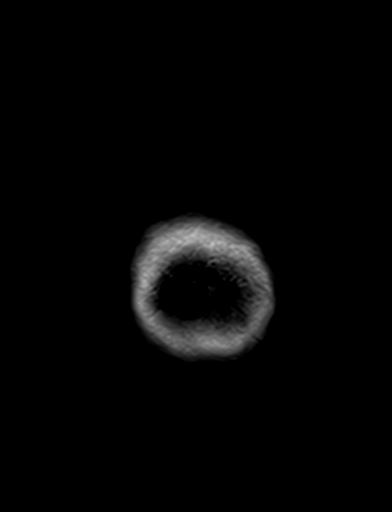

[48 of 48 positions shown; findings below may reference images not displayed]

FINDINGS: Brain: Normal cerebral volume. No restricted diffusion to suggest
acute infarction. No midline shift, mass effect, evidence of mass
lesion, ventriculomegaly, extra-axial collection or acute
intracranial hemorrhage. Cervicomedullary junction and pituitary are
within normal limits. Incidental choroid plexus cysts (normal
variant). Gray and white matter signal is within normal limits
throughout the brain. No white matter lesions or cortical
encephalomalacia identified. No chronic cerebral blood products.

No abnormal enhancement identified.  No dural thickening.

Vascular: Major intracranial vascular flow voids are preserved. The
major dural venous sinuses are enhancing and appear to be patent.

Skull and upper cervical spine: Negative visible cervical spine.
Visualized bone marrow signal is within normal limits. No osseous
abnormality identified.

Sinuses/Orbits:

Normal suprasellar cistern and optic chiasm. Cavernous sinus appears
normal. Routine MRI appearance of the optic nerves and other orbits
soft tissues is symmetric, normal.

Paranasal sinuses are clear.

Other: Mastoids are clear. Visible internal auditory structures
appear normal. Visible scalp and face appear negative.
IMPRESSION: Normal MRI appearance of the brain.

And unremarkable routine MRI appearance of the orbits. Follow-up
dedicated thin slice Orbit MRI without and with contrast might be
valuable.

## 2022-01-11 MED ORDER — GADOBENATE DIMEGLUMINE 529 MG/ML IV SOLN
20.0000 mL | Freq: Once | INTRAVENOUS | Status: AC | PRN
  Administered 2022-01-11: 20 mL via INTRAVENOUS

## 2022-01-30 ENCOUNTER — Ambulatory Visit
Admission: RE | Admit: 2022-01-30 | Discharge: 2022-01-30 | Disposition: A | Discharge status: Home / Self Care | Payer: 59 | Source: Ambulatory Visit | Attending: Medical-Surgical | Admitting: Medical-Surgical

## 2022-01-30 ENCOUNTER — Ambulatory Visit: Admission: RE | Admit: 2022-01-30 | Payer: 59 | Source: Ambulatory Visit

## 2022-01-30 DIAGNOSIS — N644 Mastodynia: Secondary | ICD-10-CM

## 2022-01-30 IMAGING — MG DIGITAL DIAGNOSTIC BILAT W/ TOMO W/ CAD
6 of 10 series · 6 of 30 positions shown · non-contrast
Comparison: Previous exam(s).

CLINICAL DATA: 40-year-old female presenting for evaluation non
spontaneous milky left nipple discharge. She was previously seen for
this same problem in [DATE], and the discharge has persisted
but not changed or worsened since that time. During that exam she
stated that she stopped nursing in [DATE] following a focal
infection of the left nipple which had resolved. After stopping
breast feeding she had experienced pain in her left nipple. She
would manually expressed some the discharge to relieve the pain, and
eventually it would reaccumulate. She denies any palpable lumps or
bloody discharge.

EXAM:
DIGITAL DIAGNOSTIC BILATERAL MAMMOGRAM WITH TOMOSYNTHESIS AND CAD
TECHNIQUE: Bilateral digital diagnostic mammography and breast tomosynthesis
was performed. The images were evaluated with computer-aided
detection.

[L MLO synth-2D (1 of 2)]
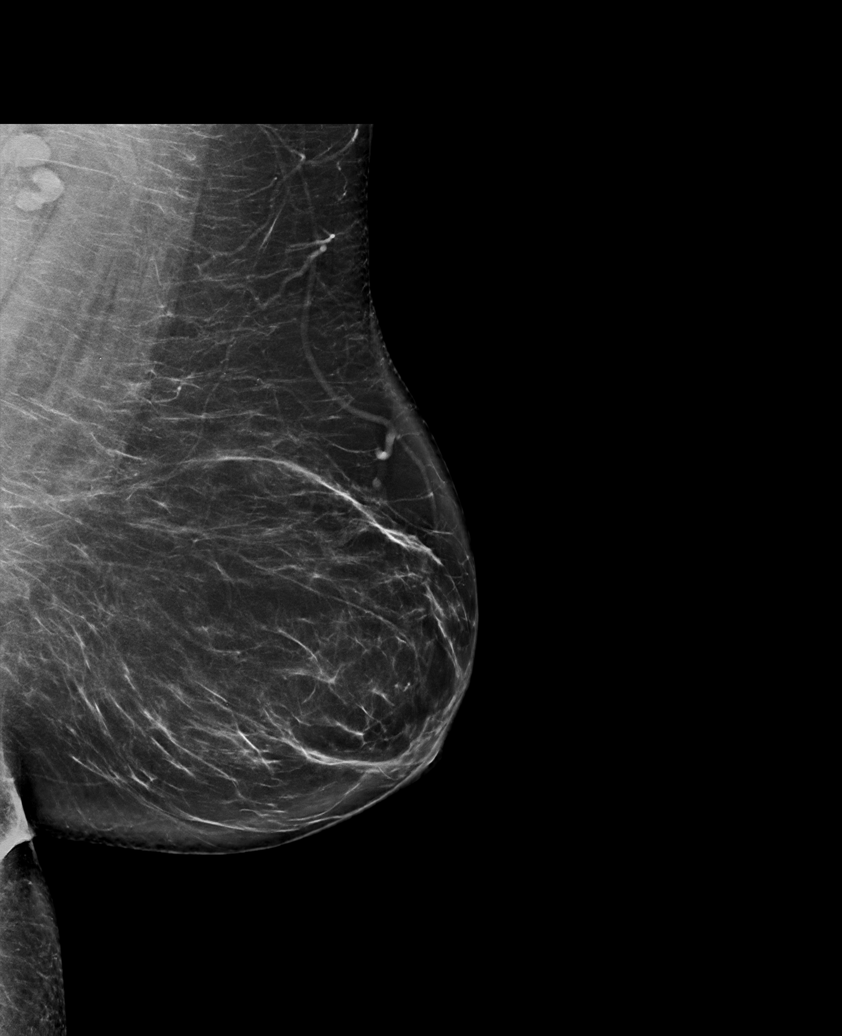

[L MLO synth-2D (2 of 2)]
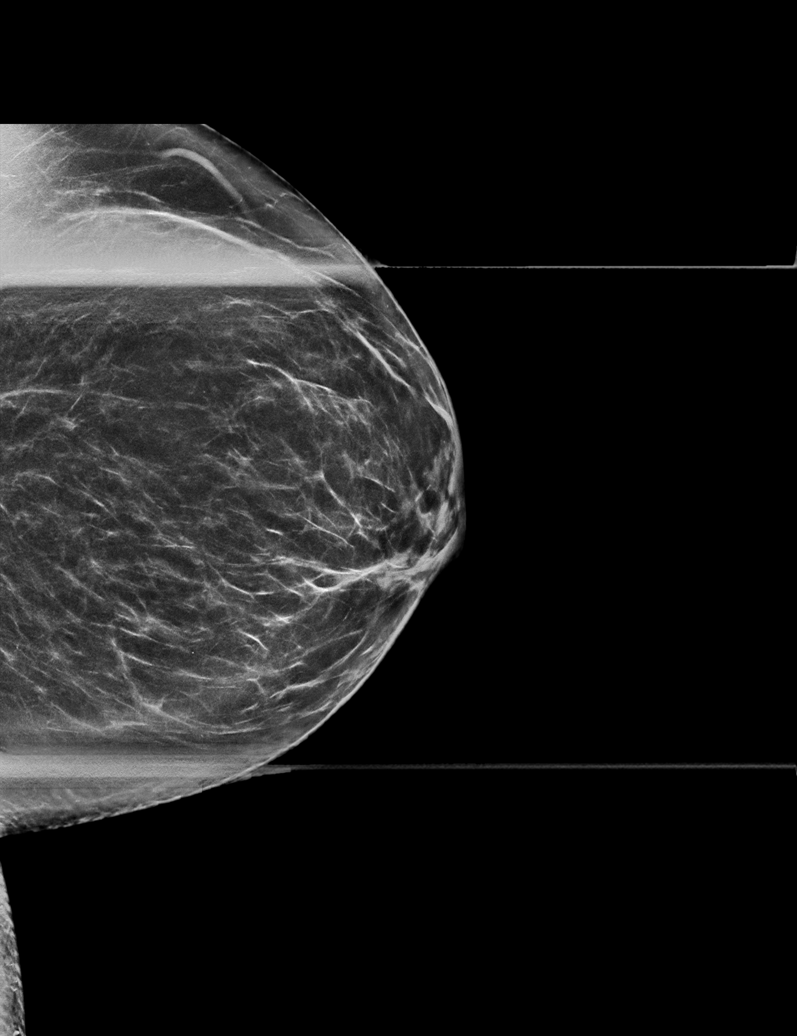

[R CC synth-2D]
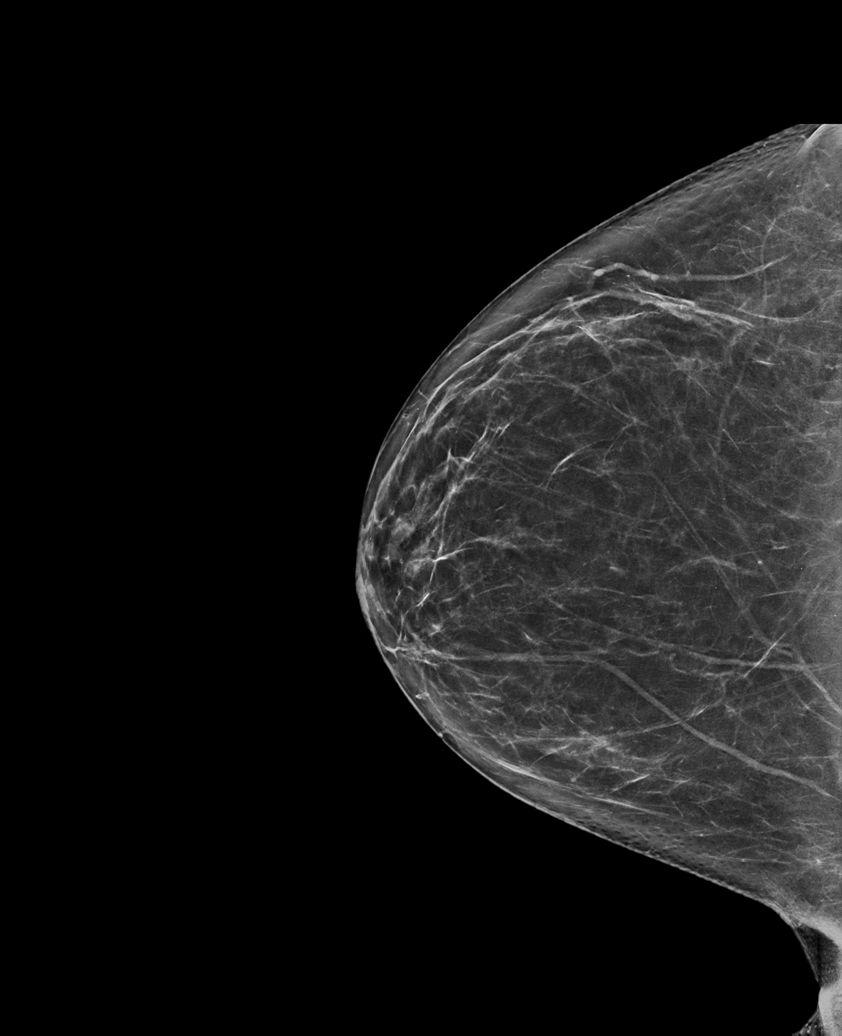

[L CC synth-2D]
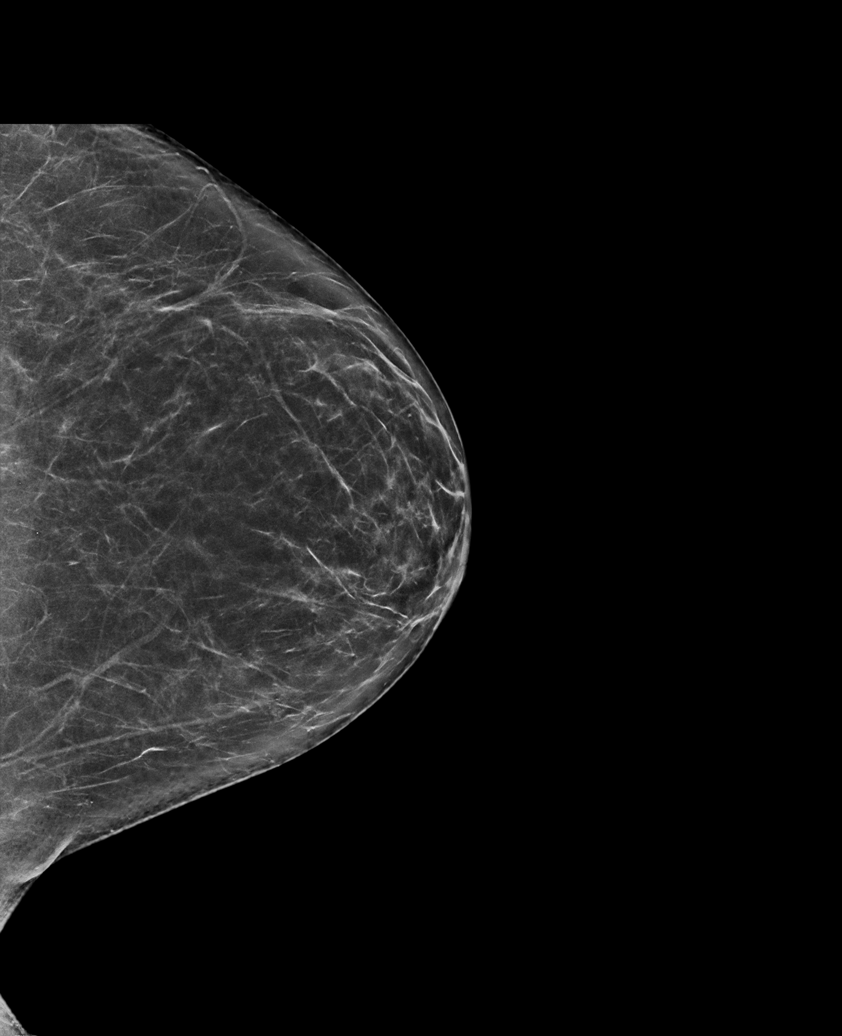

[R MLO synth-2D]
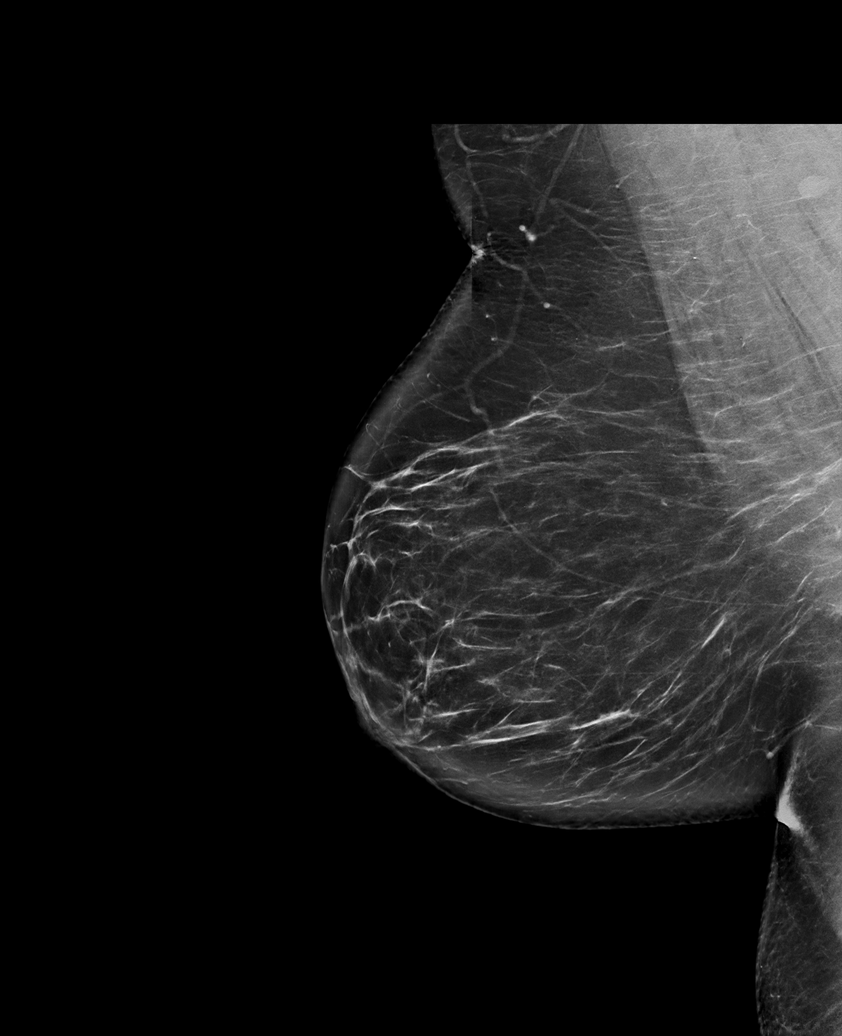

[L MLO tomo · tomo slice 31/62.0]
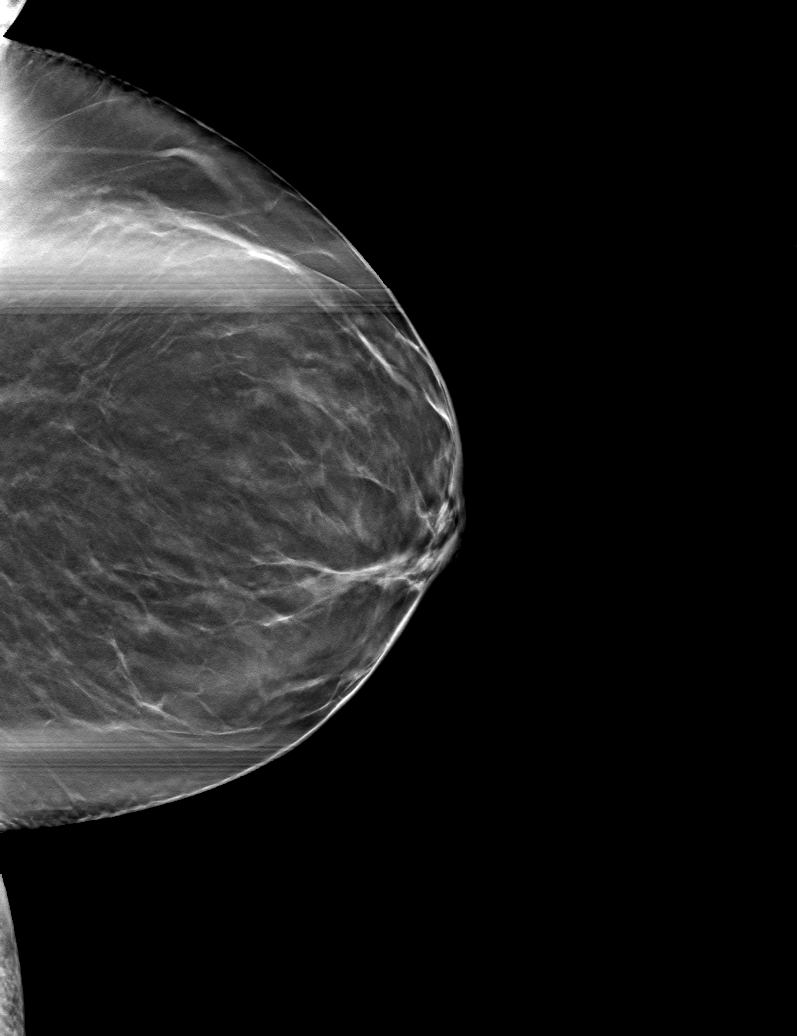

[6 of 30 positions shown; findings below may reference images not displayed]

ACR Breast Density Category b: There are scattered areas of
fibroglandular density.
FINDINGS: No suspicious calcifications, masses or areas of distortion are seen
in the bilateral breasts.
IMPRESSION: 1. No mammographic findings to correspond with the patient's
discharge.

2.  No mammographic evidence of malignancy in the bilateral breasts.

RECOMMENDATION:
1. Further management of nipple discharge should be based on
clinical assessment. This is felt to be physiologic/related to a
benign process given that it is chronic, non spontaneous and milky
in color. The patient was advised to alert her doctor if she
experiences spontaneous unilateral bloody or clear nipple discharge
from a single duct only.

2.  Screening mammogram in one year.(Code:[N6])

I have discussed the findings and recommendations with the patient.
If applicable, a reminder letter will be sent to the patient
regarding the next appointment.

BI-RADS CATEGORY  1: Negative.

## 2022-02-05 ENCOUNTER — Telehealth: Payer: 59 | Admitting: Medical-Surgical

## 2022-02-05 ENCOUNTER — Encounter: Payer: Self-pay | Admitting: Medical-Surgical

## 2022-02-05 DIAGNOSIS — F419 Anxiety disorder, unspecified: Secondary | ICD-10-CM

## 2022-02-05 DIAGNOSIS — F32A Depression, unspecified: Secondary | ICD-10-CM | POA: Diagnosis not present

## 2022-02-05 DIAGNOSIS — N6452 Nipple discharge: Secondary | ICD-10-CM

## 2022-02-05 DIAGNOSIS — N644 Mastodynia: Secondary | ICD-10-CM

## 2022-02-05 NOTE — Progress Notes (Signed)
Virtual Visit via Video Note  I connected with Martha Ware on 02/05/22 at  3:20 PM EST by a video enabled telemedicine application and verified that I am speaking with the correct person using two identifiers.   I discussed the limitations of evaluation and management by telemedicine and the availability of in person appointments. The patient expressed understanding and agreed to proceed.  Patient location: home Provider locations: office  Subjective:    CC: Mood follow-up  HPI: Pleasant 41 year old female presenting via [place] video visit to follow-up on mood concerns.  Notes that she has been taking Wellbutrin 300 mg daily as prescribed, tolerating well overall however she has noticed an increase in intrusive thoughts with the increase in her medication.  She is doing counseling once a week and feels like it is helping tremendously.  She was originally hesitant to increase her Wellbutrin dose and has trouble with the idea that her mental health may require more than 1 medication to appropriately manage.   Past medical history, Surgical history, Family history not pertinant except as noted below, Social history, Allergies, and medications have been entered into the medical record, reviewed, and corrections made.   Review of Systems: See HPI for pertinent positives and negatives.   Objective:    General: Speaking clearly in complete sentences without any shortness of breath.  Alert and oriented x3.  Normal judgment. No apparent acute distress.  Impression and Recommendations:    1. Anxiety 2. Depression, unspecified depression type After much discussion regarding options, she would like to have a treatment that will help with the intrusive thoughts since that seems to be the most bothersome of her symptoms.  For now, she would like to continue Wellbutrin at 300 mg daily and catalog what the medication seems to be doing to help as well as what possible side effects or negative effects  on going on.  I will send her a list of medications that are used to help treat intrusive thoughts so that she can do research on her own.  We will plan to follow-up in about 4 weeks to see how things are going and if she has decided on trying a different medication.  If she changes her mind in between now and then, I will be glad to send in the medication of choice.  I discussed the assessment and treatment plan with the patient. The patient was provided an opportunity to ask questions and all were answered. The patient agreed with the plan and demonstrated an understanding of the instructions.   The patient was advised to call back or seek an in-person evaluation if the symptoms worsen or if the condition fails to improve as anticipated.  25 minutes of non-face-to-face time was provided during this encounter.  Return in about 4 weeks (around 03/05/2022) for mood follow up (virtual is fine).  Clearnce Sorrel, DNP, APRN, FNP-BC [facility]

## 2022-03-03 ENCOUNTER — Other Ambulatory Visit: Payer: Self-pay | Admitting: Medical-Surgical

## 2022-03-23 ENCOUNTER — Telehealth: Payer: 59 | Admitting: Physician Assistant

## 2022-03-23 DIAGNOSIS — R0989 Other specified symptoms and signs involving the circulatory and respiratory systems: Secondary | ICD-10-CM | POA: Diagnosis not present

## 2022-03-23 NOTE — Progress Notes (Signed)
?Virtual Visit Consent  ? ?[name], you are scheduled for a virtual visit with a [facility] provider today.   ?  ?Just as with appointments in the office, your consent must be obtained to participate.  Your consent will be active for this visit and any virtual visit you may have with one of our providers in the next 365 days.   ?  ?If you have a MyChart account, a copy of this consent can be sent to you electronically.  All virtual visits are billed to your insurance company just like a traditional visit in the office.   ? ?As this is a virtual visit, video technology does not allow for your provider to perform a traditional examination.  This may limit your provider's ability to fully assess your condition.  If your provider identifies any concerns that need to be evaluated in person or the need to arrange testing (such as labs, EKG, etc.), we will make arrangements to do so.   ?  ?Although advances in technology are sophisticated, we cannot ensure that it will always work on either your end or our end.  If the connection with a video visit is poor, the visit may have to be switched to a telephone visit.  With either a video or telephone visit, we are not always able to ensure that we have a secure connection.    ? ?I need to obtain your verbal consent now.   Are you willing to proceed with your visit today?  ?  ?TERRYE DOMBROSKY has provided verbal consent on 03/23/2022 for a virtual visit (video or telephone). ?  ?Margaretann Loveless, PA-C  ? ?Date: 03/23/2022 7:53 PM ? ? ?Virtual Visit via Video Note  ? ?Martha Ware, connected with  CRUZ DEVILLA  ([id number], 1981-02-15) on 03/23/22 at  7:45 PM EDT by a video-enabled telemedicine application and verified that I am speaking with the correct person using two identifiers. ? ?Location: ?Patient: Virtual Visit Location Patient: Home ?Provider: Virtual Visit Location Provider: Home Office ?  ?I discussed the limitations of evaluation and management by  telemedicine and the availability of in person appointments. The patient expressed understanding and agreed to proceed.   ? ?History of Present Illness: ?Martha Ware is a 41 y.o. who identifies as a female who was assigned female at birth, and is being seen today for noticing veins in right arm intermittently being darker in color and more prominent than previous, and more prominent than left arm over the last 3 days or so. Denies redness, swelling, pain, weakness, numbness or tingling. Has had a recent medication change, unsure if contributing. Has noticed she has decreased intake of fluids. Also mentions she has had to carry her daughter around a lot this weekend and did carry her on her right side.  ? ? ?Problems:  ?Patient Active Problem List  ? Diagnosis Date Noted  ? Anxiety 10/01/2020  ? Depression 10/01/2020  ? Gastroesophageal reflux disease without esophagitis 10/01/2020  ? Decreased libido 10/01/2020  ?  ?Allergies:  ?Allergies  ?Allergen Reactions  ? Codeine Nausea And Vomiting  ? ?Medications:  ?Current Outpatient Medications:  ?  albuterol (VENTOLIN HFA) 108 (90 Base) MCG/ACT inhaler, Inhale 2 puffs into the lungs every 6 (six) hours as needed. , Disp: , Rfl:  ?  b complex vitamins capsule, Take 1 capsule by mouth daily., Disp: , Rfl:  ?  buPROPion (WELLBUTRIN XL) 300 MG 24 hr tablet, Take 1  tablet (300 mg total) by mouth daily., Disp: 90 tablet, Rfl: 1 ?  Cholecalciferol (VITAMIN D-3) 125 MCG (5000 UT) TABS, Take 1 tablet by mouth daily., Disp: , Rfl:  ?  cloNIDine (CATAPRES) 0.1 MG tablet, Take 0.1 mg by mouth daily as needed., Disp: , Rfl:  ?  fexofenadine (ALLEGRA) 180 MG tablet, Take 180 mg by mouth daily., Disp: , Rfl:  ?  fluticasone (FLONASE) 50 MCG/ACT nasal spray, Place 2 sprays into both nostrils daily., Disp: 16 g, Rfl: 0 ?  Multiple Vitamin tablet, Take 1 tablet by mouth daily., Disp: , Rfl:  ?  Omega-3 Fatty Acids (FISH OIL) 1000 MG CAPS, Take 1 capsule by mouth daily., Disp: , Rfl:  ?   pantoprazole (PROTONIX) 40 MG tablet, Take 1 tablet (40 mg total) by mouth daily., Disp: 90 tablet, Rfl: 1 ? ?Observations/Objective: ?Patient is well-developed, well-nourished in no acute distress.  ?Resting comfortably at home.  ?Head is normocephalic, atraumatic.  ?No labored breathing.  ?Speech is clear and coherent with logical content.  ?Patient is alert and oriented at baseline.  ? ? ?Assessment and Plan: ?1. Vein symptom ? ?- Suspect symptoms are from an overuse this weekend of the right arm with carrying her daughter and decreased fluid intake ?- Push fluids ?- Expect return to normal in coming days ?- Seek in person evaluation if symptoms continue or if any signs of infection develop ? ?Follow Up Instructions: ?I discussed the assessment and treatment plan with the patient. The patient was provided an opportunity to ask questions and all were answered. The patient agreed with the plan and demonstrated an understanding of the instructions.  A copy of instructions were sent to the patient via MyChart unless otherwise noted below.  ? ? ?The patient was advised to call back or seek an in-person evaluation if the symptoms worsen or if the condition fails to improve as anticipated. ? ?Time:  ?I spent 8 minutes with the patient via telehealth technology discussing the above problems/concerns.   ? ?Margaretann Loveless, PA-C ?

## 2022-03-23 NOTE — Patient Instructions (Signed)
?  Martha Ware, thank you for joining Mar Daring, PA-C for today's virtual visit.  While this provider is not your primary care provider (PCP), if your PCP is located in our provider database this encounter information will be shared with them immediately following your visit. ? ?Consent: ?(Patient) Martha Ware provided verbal consent for this virtual visit at the beginning of the encounter. ? ?Current Medications: ? ?Current Outpatient Medications:  ?  albuterol (VENTOLIN HFA) 108 (90 Base) MCG/ACT inhaler, Inhale 2 puffs into the lungs every 6 (six) hours as needed. , Disp: , Rfl:  ?  b complex vitamins capsule, Take 1 capsule by mouth daily., Disp: , Rfl:  ?  buPROPion (WELLBUTRIN XL) 300 MG 24 hr tablet, Take 1 tablet (300 mg total) by mouth daily., Disp: 90 tablet, Rfl: 1 ?  Cholecalciferol (VITAMIN D-3) 125 MCG (5000 UT) TABS, Take 1 tablet by mouth daily., Disp: , Rfl:  ?  cloNIDine (CATAPRES) 0.1 MG tablet, Take 0.1 mg by mouth daily as needed., Disp: , Rfl:  ?  fexofenadine (ALLEGRA) 180 MG tablet, Take 180 mg by mouth daily., Disp: , Rfl:  ?  fluticasone (FLONASE) 50 MCG/ACT nasal spray, Place 2 sprays into both nostrils daily., Disp: 16 g, Rfl: 0 ?  Multiple Vitamin tablet, Take 1 tablet by mouth daily., Disp: , Rfl:  ?  Omega-3 Fatty Acids (FISH OIL) 1000 MG CAPS, Take 1 capsule by mouth daily., Disp: , Rfl:  ?  pantoprazole (PROTONIX) 40 MG tablet, Take 1 tablet (40 mg total) by mouth daily., Disp: 90 tablet, Rfl: 1  ? ?Medications ordered in this encounter:  ?No orders of the defined types were placed in this encounter. ?  ? ?*If you need refills on other medications prior to your next appointment, please contact your pharmacy* ? ?Follow-Up: ?Call back or seek an in-person evaluation if the symptoms worsen or if the condition fails to improve as anticipated. ? ?Other Instructions ?Push fluids ? ? ?If you have been instructed to have an in-person evaluation today at a local Urgent Care  facility, please use the link below. It will take you to a list of all of our available [facility], including address, phone number and hours of operation. Please do not delay care.  ?[facility] ? ?If you or a family member do not have a primary care provider, use the link below to schedule a visit and establish care. When you choose a [facility] primary care physician or advanced practice provider, you gain a long-term partner in health. ?Find a Primary Care Provider ? ?Learn more about [facility] in-office and virtual care options: ?[facility] ?

## 2022-04-24 ENCOUNTER — Ambulatory Visit
Admission: RE | Admit: 2022-04-24 | Discharge: 2022-04-24 | Disposition: A | Discharge status: Home / Self Care | Payer: 59 | Source: Ambulatory Visit | Attending: Medical-Surgical | Admitting: Medical-Surgical

## 2022-04-24 DIAGNOSIS — N6452 Nipple discharge: Secondary | ICD-10-CM

## 2022-04-24 DIAGNOSIS — N644 Mastodynia: Secondary | ICD-10-CM

## 2022-04-24 IMAGING — MR MR BREAST BILAT WO/W CM
8 of 12 series · 33 of 48 positions shown · IV contrast (gadavist)
Comparison: Bilateral diagnostic mammogram dated [DATE]

CLINICAL DATA: Left breast pain for the past 3 years after being
bitten by her child when breast-feeding. Milky left nipple
discharge, only when expressed.

EXAM:
BILATERAL BREAST MRI WITH AND WITHOUT CONTRAST
TECHNIQUE: Multiplanar, multisequence MR images of both breasts were obtained
prior to and following the intravenous administration of 10 ml of
Gadavist

[Series 2: t2_tirm_tra ipat (a-p) · axial · 3.0mm · 0.78mm/px · 1 of 57 slices shown]
[im 1/57]
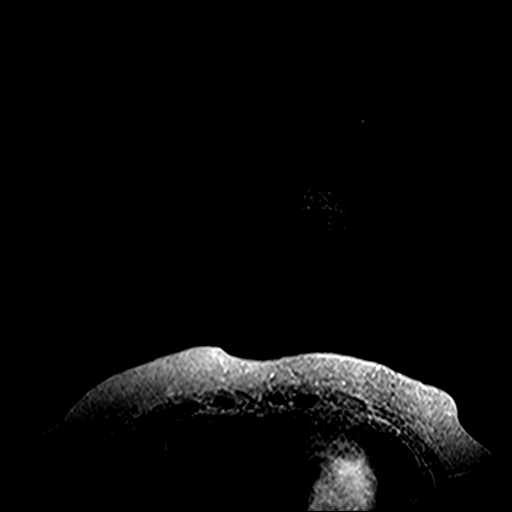

[Series 3: fl3d pre-cm no · axial · non-contrast · 1.2mm · 1.04mm/px · z∈[-58,+114]mm · 5 of 144 slices shown]
[im 1/144]
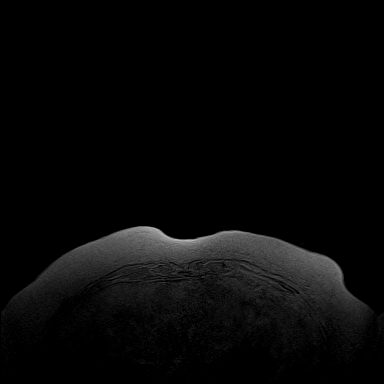
[im 36/144]
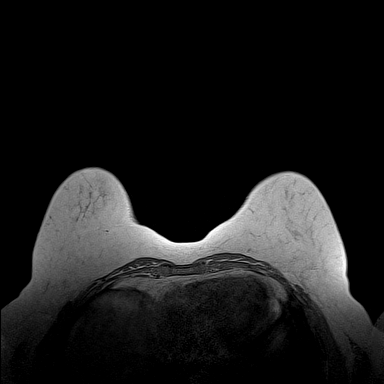
[im 72/144]
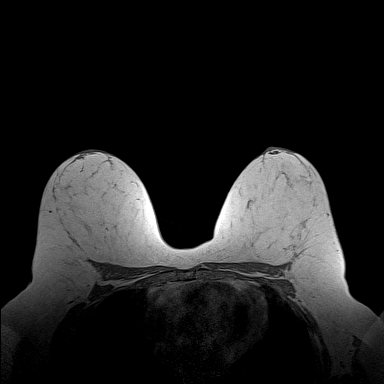
[im 108/144]
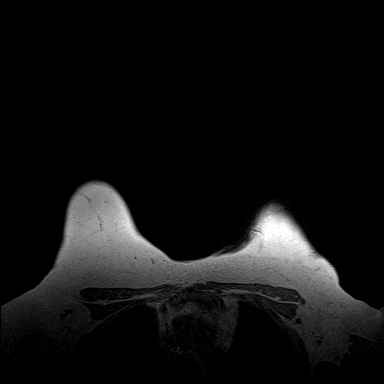
[im 144/144]
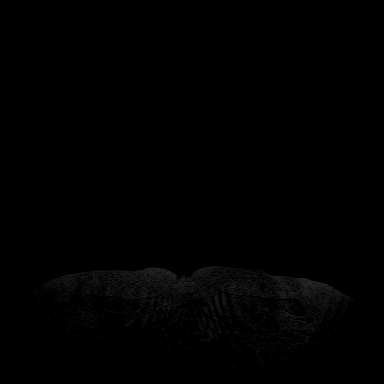

[Series 4: fl3d pre-cm · axial · non-contrast · 1.2mm · 1.04mm/px · z∈[-58,+114]mm · 5 of 144 slices shown]
[im 1/144]
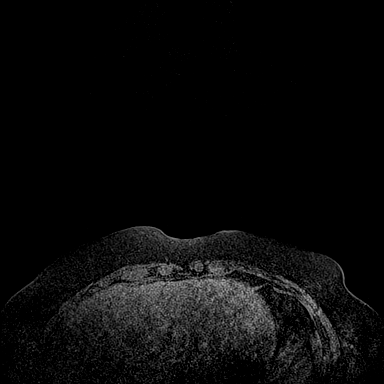
[im 36/144]
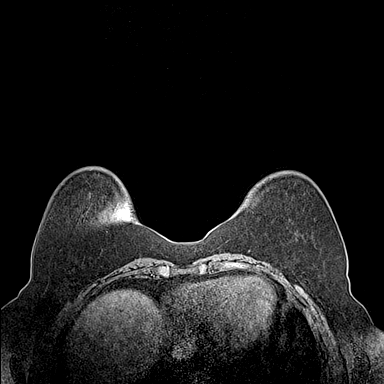
[im 72/144]
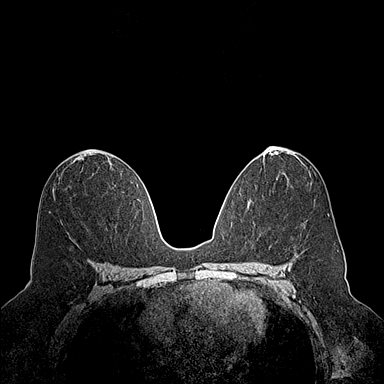
[im 108/144]
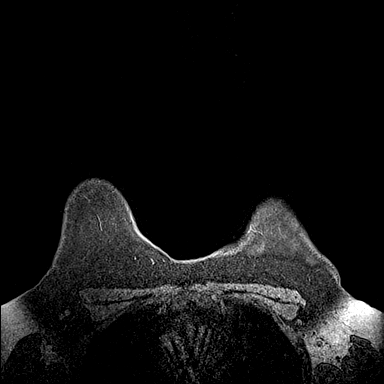
[im 144/144]
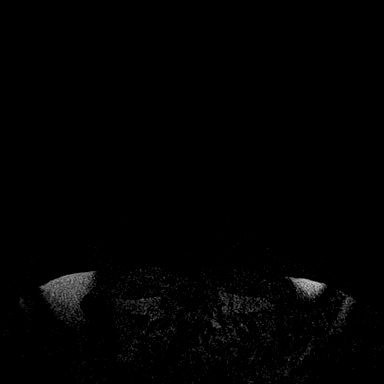

[Series 5: fl3d post-cm 20 · axial · 1.2mm · 1.04mm/px · z∈[-58,+114]mm · 5 of 144 slices shown (1 of 3)]
[im 1/144]
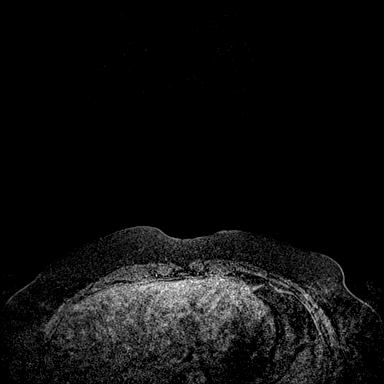
[im 36/144]
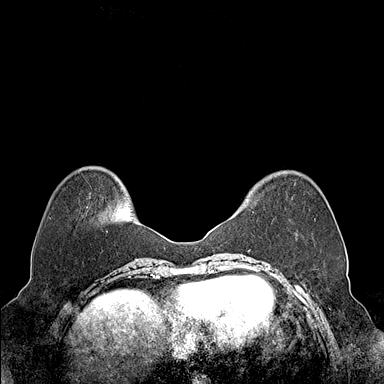
[im 72/144]
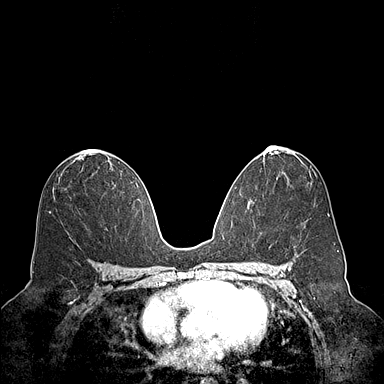
[im 108/144]
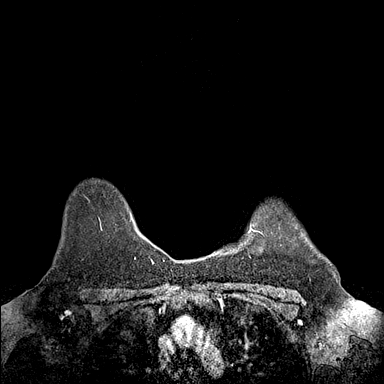
[im 144/144]
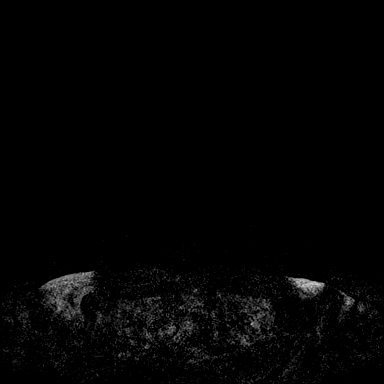

[Series 6: fl3d post-cm 20 · axial · 1.2mm · 1.04mm/px · z∈[-58,+114]mm · 5 of 144 slices shown (2 of 3)]
[im 1/144]
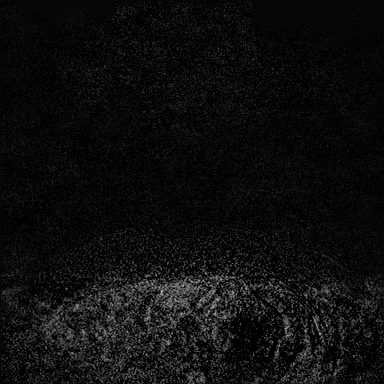
[im 36/144]
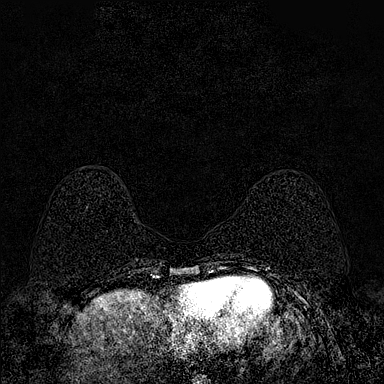
[im 72/144]
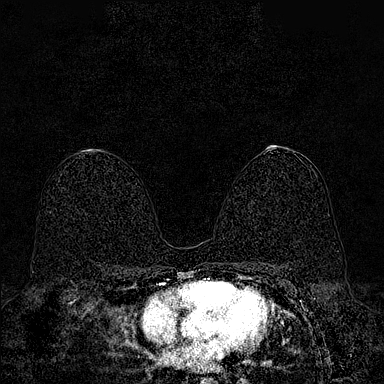
[im 108/144]
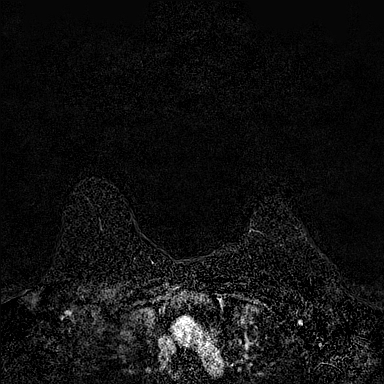
[im 144/144]
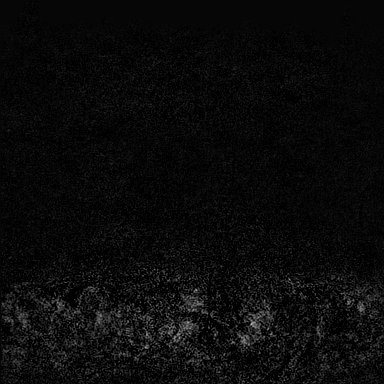

[Series 7: fl3d post-cm 20 · axial · 172.8mm · 1.04mm/px · 1 of 1 slices shown (3 of 3)]
[im 1/1]
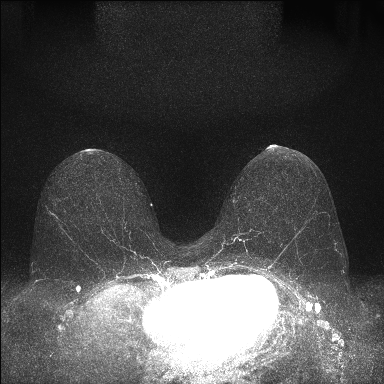

[Series 8: fl3d post-cm 3 · axial · 1.2mm · 1.04mm/px · z∈[-58,+114]mm · 6 of 144 slices shown (1 of 2)]
[im 1/144]
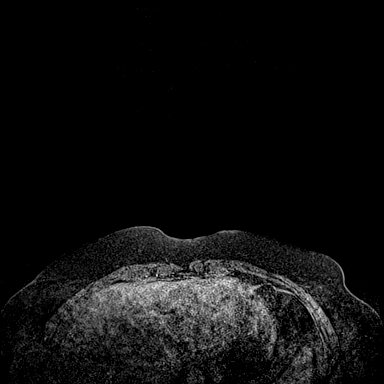
[im 29/144]
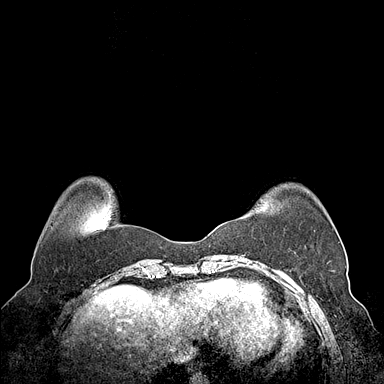
[im 58/144]
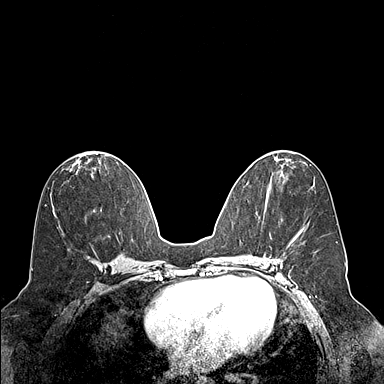
[im 86/144]
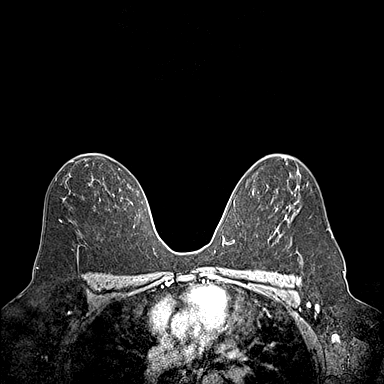
[im 115/144]
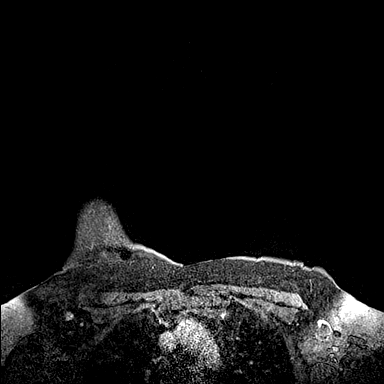
[im 144/144]
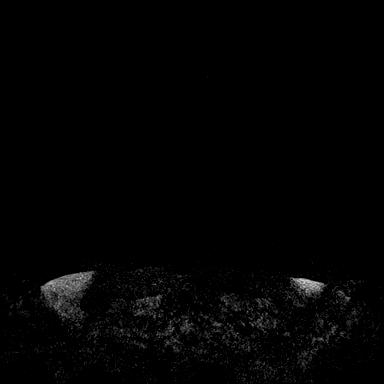

[Series 9: fl3d post-cm 3 · axial · 1.2mm · 1.04mm/px · z∈[-58,+79]mm · 5 of 144 slices shown (2 of 2)]
[im 1/144]
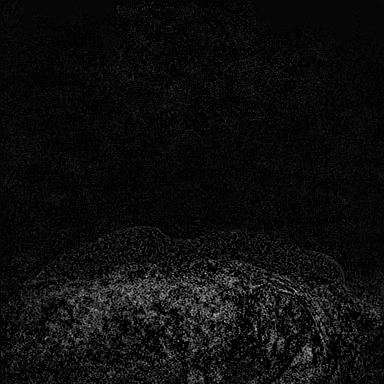
[im 29/144]
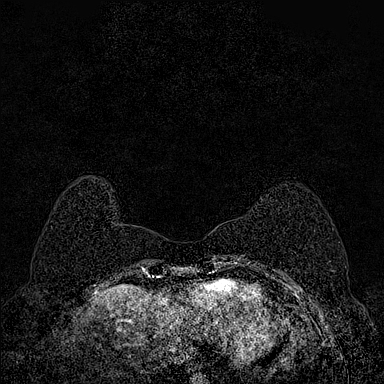
[im 58/144]
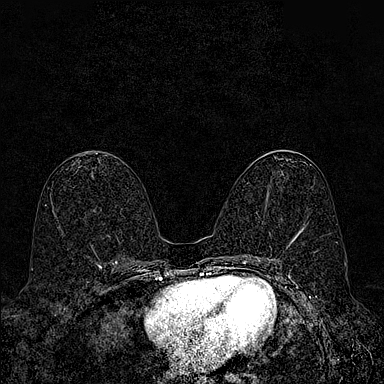
[im 86/144]
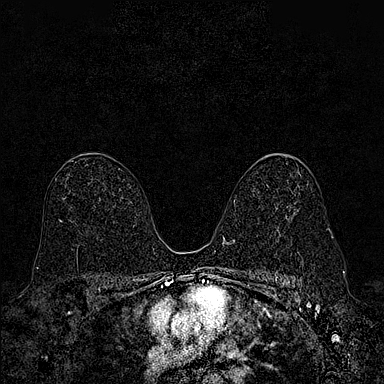
[im 115/144]
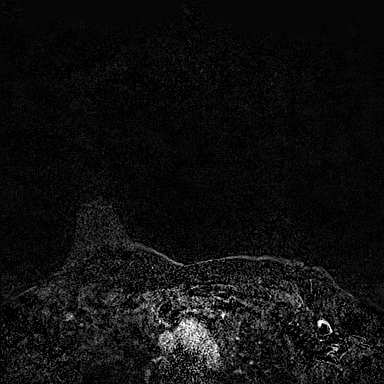

[33 of 48 positions shown; findings below may reference images not displayed]

Three-dimensional MR images were rendered by post-processing of the
original MR data on an independent workstation. The
three-dimensional MR images were interpreted, and findings are
reported in the following complete MRI report for this study. Three
dimensional images were evaluated at the independent interpreting
workstation using the DynaCAD thin client.
FINDINGS: Breast composition: b. Scattered fibroglandular tissue.

Background parenchymal enhancement: Minimal.

Right breast: No mass or abnormal enhancement.

Left breast: No mass or abnormal enhancement.

Lymph nodes: No abnormal appearing lymph nodes.

Ancillary findings:  None.
IMPRESSION: Normal examination.  No evidence of malignancy in either breast.

RECOMMENDATION:
Bilateral screening mammogram in [DATE] when due.

BI-RADS CATEGORY  1: Negative.

## 2022-04-24 MED ORDER — GADOBUTROL 1 MMOL/ML IV SOLN
10.0000 mL | Freq: Once | INTRAVENOUS | Status: AC | PRN
  Administered 2022-04-24: 10 mL via INTRAVENOUS

## 2022-04-30 ENCOUNTER — Encounter: Payer: Self-pay | Admitting: Medical-Surgical

## 2022-07-28 NOTE — Progress Notes (Signed)
[facility] Note  08/04/2022     CHIEF COMPLAINT Patient presents for Retina Evaluation  HISTORY OF PRESENT ILLNESS: Martha Ware is a 41 y.o. female who presents to the clinic today for:   HPI     Retina Evaluation   In right eye.  This started 4 years ago.  Associated Symptoms Blind Spot.  I, the attending physician,  performed the HPI with the patient and updated documentation appropriately.        Comments   Patient feels that the blind spot in the right and its getting bigger. She is being referred by Dr. Lucianne Lei for a scotoma in the right eye.      Last edited by Bernarda Caffey, MD on 08/04/2022 12:51 PM.    Pt is here on the referral of Dr. Lucianne Lei for concern of scotoma in the right eye, pt states she is unsure if she had it before she got pregnant, but she noticed it when she was pregnat with her daughter in 2018, she states she went to see a dr in [place] at that time and they told her she was fine, when pt moved here, she had a hard time passing the eye test to get her driver's licence, she states the spot in her eye is bigger than it was 4 years ago, pts father has optic nerve pits and a "lengthy eye hx" Per pt   Referring physician: Samuel Bouche, NP [address] [place],  [place] [address]  HISTORICAL INFORMATION:   Selected notes from the MEDICAL RECORD NUMBER Referred by Dr. 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 (Ophthalmic Drugs)   No current facility-administered medications for this visit. (Ophthalmic Drugs)   Current Outpatient Medications (Other)  Medication Sig   albuterol (VENTOLIN HFA) 108 (90 Base) MCG/ACT inhaler Inhale 2 puffs into the lungs every 6 (six) hours as needed.    b complex vitamins capsule Take 1 capsule by mouth daily.   buPROPion (WELLBUTRIN XL) 300 MG 24 hr tablet  Take 1 tablet (300 mg total) by mouth daily.   Cholecalciferol (VITAMIN D-3) 125 MCG (5000 UT) TABS Take 1 tablet by mouth daily.   cloNIDine (CATAPRES) 0.1 MG tablet Take 0.1 mg by mouth daily as needed.   fexofenadine (ALLEGRA) 180 MG tablet Take 180 mg by mouth daily.   fluticasone (FLONASE) 50 MCG/ACT nasal spray Place 2 sprays into both nostrils daily.   Multiple Vitamin tablet Take 1 tablet by mouth daily.   Omega-3 Fatty Acids (FISH OIL) 1000 MG CAPS Take 1 capsule by mouth daily.   pantoprazole (PROTONIX) 40 MG tablet Take 1 tablet (40 mg total) by mouth daily.   No current facility-administered medications for this visit. (Other)   REVIEW OF SYSTEMS: ROS   Positive for: Eyes, Psychiatric Last edited by Bernarda Caffey, MD on 08/04/2022 12:50 PM.     ALLERGIES Allergies  Allergen Reactions   Codeine Nausea And Vomiting   PAST MEDICAL HISTORY Past Medical History:  Diagnosis Date   Allergy    Anxiety    Asthma    Depression    GERD (gastroesophageal reflux disease)    Hx of abnormal cervical Pap smear 2006   Hx of cholecystectomy    Past Surgical History:  Procedure Laterality Date   CHOLECYSTECTOMY     COLPOSCOPY W/ BIOPSY /  CURETTAGE  2006   LYMPHADENECTOMY     TONSILLECTOMY     FAMILY HISTORY Family History  Problem Relation Age of Onset   Hypertension Mother    Endometrial cancer Mother    Healthy Father    Hypothyroidism Maternal Grandmother    Alcohol abuse Maternal Grandfather    SOCIAL HISTORY Social History   Tobacco Use   Smoking status: Every Day    Types: E-cigarettes   Smokeless tobacco: Never  Vaping Use   Vaping Use: Every day  Substance Use Topics   Alcohol use: Yes    Alcohol/week: 6.0 standard drinks of alcohol    Types: 6 Glasses of wine per week   Drug use: Yes    Frequency: 4.0 times per week    Types: Marijuana       OPHTHALMIC EXAM:  Base Eye Exam     Visual Acuity (Snellen - Linear)       Right Left   Dist cc 20/25  20/20    Correction: Glasses         Tonometry (Tonopen, 9:30 AM)       Right Left   Pressure 17 17         Pupils       Dark Light Shape React APD   Right 4 3 Round Brisk None   Left 4 3 Round Brisk None         Visual Fields       Left Right    Full Full         Extraocular Movement       Right Left    Full, Ortho Full, Ortho         Neuro/Psych     Oriented x3: Yes         Dilation     Both eyes: 2.5% Phenylephrine, 1.0% Mydriacyl @ 9:26 AM           Slit Lamp and Fundus Exam     Slit Lamp Exam       Right Left   Lids/Lashes Normal Normal   Conjunctiva/Sclera White and quiet White and quiet   Cornea Clear Clear   Anterior Chamber deep and clear deep and clear   Iris Round and dilated Round and dilated   Lens Clear Clear   Anterior Vitreous mild syneresis mild syneresis         Fundus Exam       Right Left   Disc Anomalous, very thin rim, very deep cup, ?optic pit Pink and Sharp   C/D Ratio  0.6   Macula Flat, Good foveal reflex, focal RPE mottling / atrophy IN macula (improved visualization with red free filter), trace cystic changes nasal macula - ?schisis, no heme Flat, Good foveal reflex, No heme or edema   Vessels mild attenuation, mild copper wiring, mild AV crossing changes mild attenuation, mild copper wiring, mild tortuosity   Periphery Attached Attached           Refraction     Wearing Rx       Sphere Cylinder Axis   Right -3.50 Sphere    Left -3.50 +0.25 151           IMAGING AND PROCEDURES  Imaging and Procedures for 08/04/2022  OCT, Retina - OU - Both Eyes       Right Eye Quality was good. Central Foveal Thickness: 324. Progression has no prior data. Findings include normal foveal contour, no SRF, intraretinal fluid, vitreomacular adhesion (  Mild cystic changes nasal fovea and macula, focal IRA IN to disc).   Left Eye Quality was good. Central Foveal Thickness: 265. Progression has no prior data.  Findings include normal foveal contour, no IRF, no SRF, vitreomacular adhesion .   Notes *Images captured and stored on drive  Diagnosis / Impression:  OD: Mild cystic changes nasal fovea and macula -- ?schisis  OS: NFP, no IRF/SRF  Clinical management:  See below  Abbreviations: NFP - Normal foveal profile. CME - cystoid macular edema. PED - pigment epithelial detachment. IRF - intraretinal fluid. SRF - subretinal fluid. EZ - ellipsoid zone. ERM - epiretinal membrane. ORA - outer retinal atrophy. ORT - outer retinal tubulation. SRHM - subretinal hyper-reflective material. IRHM - intraretinal hyper-reflective material      Fluorescein Angiography Optos (Transit OD)       Right Eye Progression has no prior data. Early phase findings include staining. Mid/Late phase findings include staining (No leakage, no CNV, no petaloid hyper fluorescence, focal staining IN mac, late staining of disc).   Left Eye Progression has no prior data. Early phase findings include normal observations. Mid/Late phase findings include normal observations.   Notes **Images stored on drive**  Impression: OD: No leakage, no CNV, no petaloid hyper fluorescence, focal staining IN mac, late staining of disc OS: normal study            ASSESSMENT/PLAN:    ICD-10-CM   1. 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 Hypertensive retinopathy of both eyes  H35.033 Fluorescein Angiography Optos (Transit OD)     1,2. Focal macular atrophy w/ corresponding scotoma OD  - pt reports onset ~4 yrs ago during or right before pregnancy  - pt states that scotoma is subjectively enlarging  - scotoma is superotemporal paracentral in location, corresponding to focal retinal atrophy in inf  nasal macula - OCT shows focal retinal thinning / CR atrophy inf nasal macula - FA 08.15.23 shows focal staining in area of atrophy ?window defect? - BCVA remains 20/20 - unclear etiology of focal atrophy - may benefit from formal HVF testing to quantify scotoma - discussed findings - no retinal or ophthalmic interventions indicated or recommended  - recommend monitoring   3. Mild retinoschisis OD  - tr cystic changes nasal macula  - FA 08.15.23 shows no staining or leakage corresponding to cystic changes so suspect retinoschisis  - monitor  4,5. 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    Return for 4-6 months, focal macular atrophy w/ visual field scotoma OD, DFE, OCT.  There are no Patient Instructions on file for this visit.   Explained the diagnoses, plan, and follow up with the patient and they expressed understanding.  Patient expressed understanding of the importance of proper follow up care.   This document serves as a record of  services personally performed by Gardiner Sleeper, MD, PhD. It was created on their behalf by Renaldo Reel, [place] an ophthalmic technician. The creation of this record is the provider's dictation and/or activities during the visit.    Electronically signed by:  Renaldo Reel, COT  07/28/22 1:05 PM  This document serves as a record of services personally performed by Gardiner Sleeper, MD, PhD. It was created on their behalf by San Jetty. Owens Shark, OA an ophthalmic technician. The creation of this record is the provider's dictation and/or activities during the visit.    Electronically signed by: San Jetty. Owens Shark, [place] 08.08.2023 1:05  PM  Gardiner Sleeper, M.D., Ph.D. Diseases & Surgery of the Retina and Vitreous [facility]  I have reviewed the above documentation for accuracy and completeness, and I agree with the above. Gardiner Sleeper, M.D., Ph.D. 08/04/22 1:05 PM  Abbreviations: M myopia (nearsighted); A astigmatism; H hyperopia (farsighted); P presbyopia; Mrx spectacle prescription;  CTL contact lenses; OD right eye; OS left eye; OU both eyes  XT exotropia; ET esotropia; PEK punctate epithelial keratitis; PEE punctate epithelial erosions; DES dry eye syndrome; MGD meibomian gland dysfunction; ATs artificial tears; PFAT's preservative free artificial tears; [place] nuclear sclerotic cataract; PSC posterior subcapsular cataract; ERM epi-retinal membrane; PVD posterior vitreous detachment; RD retinal detachment; DM diabetes mellitus; DR diabetic retinopathy; NPDR non-proliferative diabetic retinopathy; PDR proliferative diabetic retinopathy; CSME clinically significant macular edema; DME diabetic macular edema; dbh dot blot hemorrhages; CWS cotton wool spot; POAG primary open angle glaucoma; C/D cup-to-disc ratio; HVF humphrey visual field; GVF goldmann visual field; OCT optical coherence tomography; IOP intraocular pressure; BRVO Branch retinal vein occlusion; CRVO central retinal vein occlusion; CRAO central retinal artery occlusion; BRAO branch retinal artery occlusion; RT retinal tear; SB scleral buckle; PPV pars plana vitrectomy; VH Vitreous hemorrhage; PRP panretinal laser photocoagulation; IVK intravitreal kenalog; VMT vitreomacular traction; MH Macular hole;  NVD neovascularization of the disc; NVE neovascularization elsewhere; AREDS age related eye disease study; ARMD age related macular degeneration; POAG primary open angle glaucoma; EBMD epithelial/anterior basement membrane dystrophy; ACIOL anterior chamber intraocular lens; IOL intraocular lens; PCIOL posterior chamber intraocular lens; Phaco/IOL  phacoemulsification with intraocular lens placement; [place] photorefractive keratectomy; LASIK laser assisted in situ keratomileusis; HTN hypertension; DM diabetes mellitus; COPD chronic obstructive pulmonary disease

## 2022-08-04 ENCOUNTER — Encounter (INDEPENDENT_AMBULATORY_CARE_PROVIDER_SITE_OTHER): Payer: Self-pay | Admitting: Ophthalmology

## 2022-08-04 ENCOUNTER — Ambulatory Visit (INDEPENDENT_AMBULATORY_CARE_PROVIDER_SITE_OTHER): Payer: 59 | Admitting: Ophthalmology

## 2022-08-04 DIAGNOSIS — H3589 Other specified retinal disorders: Secondary | ICD-10-CM

## 2022-08-04 DIAGNOSIS — Q078 Other specified congenital malformations of nervous system: Secondary | ICD-10-CM

## 2022-08-04 DIAGNOSIS — I10 Essential (primary) hypertension: Secondary | ICD-10-CM

## 2022-08-04 DIAGNOSIS — H53411 Scotoma involving central area, right eye: Secondary | ICD-10-CM

## 2022-08-04 DIAGNOSIS — H35033 Hypertensive retinopathy, bilateral: Secondary | ICD-10-CM

## 2022-08-04 DIAGNOSIS — H33101 Unspecified retinoschisis, right eye: Secondary | ICD-10-CM

## 2022-08-04 DIAGNOSIS — H47391 Other disorders of optic disc, right eye: Secondary | ICD-10-CM

## 2022-08-22 ENCOUNTER — Other Ambulatory Visit: Payer: Self-pay | Admitting: Medical-Surgical

## 2022-08-22 DIAGNOSIS — K219 Gastro-esophageal reflux disease without esophagitis: Secondary | ICD-10-CM

## 2022-08-26 NOTE — Telephone Encounter (Signed)
Patient needs an appointment for further refills.  Last video visit 02/05/2022  Last filled 12/31/2021

## 2022-08-27 NOTE — Telephone Encounter (Signed)
Patient stated that she didn't need a appt. nor a Rx. - tvt

## 2022-10-12 ENCOUNTER — Ambulatory Visit
Admission: EM | Admit: 2022-10-12 | Discharge: 2022-10-12 | Disposition: A | Discharge status: Home / Self Care | Payer: 59

## 2022-10-12 DIAGNOSIS — J4540 Moderate persistent asthma, uncomplicated: Secondary | ICD-10-CM

## 2022-10-12 DIAGNOSIS — H6993 Unspecified Eustachian tube disorder, bilateral: Secondary | ICD-10-CM

## 2022-10-12 DIAGNOSIS — H9203 Otalgia, bilateral: Secondary | ICD-10-CM | POA: Diagnosis not present

## 2022-10-12 DIAGNOSIS — J018 Other acute sinusitis: Secondary | ICD-10-CM

## 2022-10-12 DIAGNOSIS — R062 Wheezing: Secondary | ICD-10-CM | POA: Diagnosis not present

## 2022-10-12 MED ORDER — PREDNISONE 50 MG PO TABS: 50.0000 mg | ORAL_TABLET | Freq: Every day | ORAL | 0 refills | Status: DC

## 2022-10-12 MED ORDER — AMOXICILLIN-POT CLAVULANATE 875-125 MG PO TABS: 1.0000 | ORAL_TABLET | Freq: Two times a day (BID) | ORAL | 0 refills | Status: DC

## 2022-10-12 MED ORDER — LEVOCETIRIZINE DIHYDROCHLORIDE 5 MG PO TABS: 5.0000 mg | ORAL_TABLET | Freq: Every evening | ORAL | 0 refills | Status: DC

## 2022-10-12 NOTE — ED Provider Notes (Signed)
[facility]  Note:  This document was prepared using Systems analyst and may include unintentional dictation errors.  MRN: [id number] DOB: 1981-07-29  Subjective:   Martha Ware is a 41 y.o. female presenting for several week history of persistent and worsening bilateral ear fullness, congestion, and, chest tightness, malaise and fatigue.  Patient does vape and use marijuana consistently.  Has a history of asthma and is on Symbicort.  No current facility-administered medications for this encounter.  Current Outpatient Medications:    budesonide-formoterol (SYMBICORT) 160-4.5 MCG/ACT inhaler, , Disp: , Rfl:    FLUoxetine (PROZAC) 40 MG capsule, , Disp: , Rfl:    albuterol (VENTOLIN HFA) 108 (90 Base) MCG/ACT inhaler, Inhale 2 puffs into the lungs every 6 (six) hours as needed. , Disp: , Rfl:    b complex vitamins capsule, Take 1 capsule by mouth daily., Disp: , Rfl:    buPROPion (WELLBUTRIN XL) 150 MG 24 hr tablet, Take 150 mg by mouth daily., Disp: , Rfl:    buPROPion (WELLBUTRIN XL) 300 MG 24 hr tablet, Take 1 tablet (300 mg total) by mouth daily., Disp: 90 tablet, Rfl: 1   Cholecalciferol (VITAMIN D-3) 125 MCG (5000 UT) TABS, Take 1 tablet by mouth daily., Disp: , Rfl:    cloNIDine (CATAPRES) 0.1 MG tablet, Take 0.1 mg by mouth daily as needed., Disp: , Rfl:    fexofenadine (ALLEGRA) 180 MG tablet, Take 180 mg by mouth daily., Disp: , Rfl:    fluticasone (FLONASE) 50 MCG/ACT nasal spray, Place 2 sprays into both nostrils daily., Disp: 16 g, Rfl: 0   Multiple Vitamin tablet, Take 1 tablet by mouth daily., Disp: , Rfl:    Omega-3 Fatty Acids (FISH OIL) 1000 MG CAPS, Take 1 capsule by mouth daily., Disp: , Rfl:    pantoprazole (PROTONIX) 40 MG tablet, Take 1 tablet (40 mg total) by mouth daily., Disp: 90 tablet, Rfl: 1   Allergies  Allergen Reactions   Codeine Nausea And Vomiting    Past Medical History:  Diagnosis Date   Allergy     Anxiety    Asthma    Depression    GERD (gastroesophageal reflux disease)    Hx of abnormal cervical Pap smear 2006   Hx of cholecystectomy      Past Surgical History:  Procedure Laterality Date   CHOLECYSTECTOMY     COLPOSCOPY W/ BIOPSY / CURETTAGE  2006   LYMPHADENECTOMY     TONSILLECTOMY      Family History  Problem Relation Age of Onset   Hypertension Mother    Endometrial cancer Mother    Healthy Father    Hypothyroidism Maternal Grandmother    Alcohol abuse Maternal Grandfather     Social History   Tobacco Use   Smoking status: Every Day    Types: E-cigarettes   Smokeless tobacco: Never  Vaping Use   Vaping Use: Every day  Substance Use Topics   Alcohol use: Yes    Alcohol/week: 6.0 standard drinks of alcohol    Types: 6 Glasses of wine per week   Drug use: Yes    Frequency: 4.0 times per week    Types: Marijuana    ROS   Objective:   Vitals: BP 109/76 (BP Location: Left Arm)   Pulse 72   Temp 98.3 F (36.8 C) (Oral)   Resp 16   SpO2 98%   Physical Exam Constitutional:      General: She is not in  acute distress.    Appearance: Normal appearance. She is well-developed and normal weight. She is not ill-appearing, toxic-appearing or diaphoretic.  HENT:     Head: Normocephalic and atraumatic.     Right Ear: Tympanic membrane, ear canal and external ear normal. No drainage or tenderness. No middle ear effusion. There is no impacted cerumen. Tympanic membrane is not erythematous or bulging.     Left Ear: Tympanic membrane, ear canal and external ear normal. No drainage or tenderness.  No middle ear effusion. There is no impacted cerumen. Tympanic membrane is not erythematous or bulging.     Nose: Congestion present. No rhinorrhea.     Mouth/Throat:     Mouth: Mucous membranes are moist. No oral lesions.     Pharynx: No pharyngeal swelling, oropharyngeal exudate, posterior oropharyngeal erythema or uvula swelling.     Tonsils: No tonsillar exudate or  tonsillar abscesses.  Eyes:     General: No scleral icterus.       Right eye: No discharge.        Left eye: No discharge.     Extraocular Movements: Extraocular movements intact.     Right eye: Normal extraocular motion.     Left eye: Normal extraocular motion.     Conjunctiva/sclera: Conjunctivae normal.  Cardiovascular:     Rate and Rhythm: Normal rate and regular rhythm.     Heart sounds: Normal heart sounds. No murmur heard.    No friction rub. No gallop.  Pulmonary:     Effort: Pulmonary effort is normal. No respiratory distress.     Breath sounds: No stridor. Wheezing (inspiratory) present. No rhonchi or rales.  Chest:     Chest wall: No tenderness.  Musculoskeletal:     Cervical back: Normal range of motion and neck supple.  Lymphadenopathy:     Cervical: No cervical adenopathy.  Skin:    General: Skin is warm and dry.  Neurological:     General: No focal deficit present.     Mental Status: She is alert and oriented to person, place, and time.  Psychiatric:        Mood and Affect: Mood normal.        Behavior: Behavior normal.     Assessment and Plan :   PDMP not reviewed this encounter.  1. Other acute sinusitis, recurrence not specified   2. Acute ear pain, bilateral   3. Inspiratory wheezing   4. Moderate persistent asthma without complication   5. Eustachian tube dysfunction, bilateral     Will start empiric treatment for sinusitis with Augmentin.  Recommended an oral prednisone course in the context of her wheezing, asthma and persistent sinus symptoms.  Use supportive care otherwise.  Deferred x-ray.  Counseled patient on potential for adverse effects with medications prescribed/recommended today, ER and return-to-clinic precautions discussed, patient verbalized understanding.    Wallis Bamberg, [place] 10/12/22 1942

## 2022-10-12 NOTE — ED Triage Notes (Signed)
Pt reports ear fullness and pain for several days.

## 2022-11-13 ENCOUNTER — Telehealth: Payer: 59 | Admitting: Family Medicine

## 2022-11-13 DIAGNOSIS — K047 Periapical abscess without sinus: Secondary | ICD-10-CM | POA: Diagnosis not present

## 2022-11-13 DIAGNOSIS — J4540 Moderate persistent asthma, uncomplicated: Secondary | ICD-10-CM

## 2022-11-13 NOTE — Progress Notes (Signed)
Virtual Visit Consent   Martha Ware, you are scheduled for a virtual visit with a [facility] provider today. Just as with appointments in the office, your consent must be obtained to participate. Your consent will be active for this visit and any virtual visit you may have with one of our providers in the next 365 days. If you have a MyChart account, a copy of this consent can be sent to you electronically.  As this is a virtual visit, video technology does not allow for your provider to perform a traditional examination. This may limit your provider's ability to fully assess your condition. If your provider identifies any concerns that need to be evaluated in person or the need to arrange testing (such as labs, EKG, etc.), we will make arrangements to do so. Although advances in technology are sophisticated, we cannot ensure that it will always work on either your end or our end. If the connection with a video visit is poor, the visit may have to be switched to a telephone visit. With either a video or telephone visit, we are not always able to ensure that we have a secure connection.  By engaging in this virtual visit, you consent to the provision of healthcare and authorize for your insurance to be billed (if applicable) for the services provided during this visit. Depending on your insurance coverage, you may receive a charge related to this service.  I need to obtain your verbal consent now. Are you willing to proceed with your visit today? Martha Ware has provided verbal consent on 11/13/2022 for a virtual visit (video or telephone). Dellia Nims, FNP  Date: 11/13/2022 12:49 PM  Virtual Visit via Video Note   I, Dellia Nims, connected with  Martha Ware  ([id number], 1981-01-05) on 11/13/22 at 12:45 PM EST by a video-enabled telemedicine application and verified that I am speaking with the correct person using two identifiers.  Location: Patient: Virtual Visit Location Patient:  Home Provider: Virtual Visit Location Provider: Home Office   I discussed the limitations of evaluation and management by telemedicine and the availability of in person appointments. The patient expressed understanding and agreed to proceed.    History of Present Illness: Martha Ware is a 41 y.o. who identifies as a female who was assigned female at birth, and is being seen today for discussion regarding next steps to take in her health issues.She started in June with asthma flare. That has been mostly controlled with symbicort and albuterol. September to October her sinuses becaem infected and she took augmentin then immed after her tooth became infected and she had to take augmentin again. She says her tooth is still tender, her head is congested and she can't get a deep breath at times. Marland Kitchen  HPI: HPI  Problems:  Patient Active Problem List   Diagnosis Date Noted   Anxiety 10/01/2020   Depression 10/01/2020   Gastroesophageal reflux disease without esophagitis 10/01/2020   Decreased libido 10/01/2020    Allergies:  Allergies  Allergen Reactions   Codeine Nausea And Vomiting   Medications:  Current Outpatient Medications:    albuterol (VENTOLIN HFA) 108 (90 Base) MCG/ACT inhaler, Inhale 2 puffs into the lungs every 6 (six) hours as needed. , Disp: , Rfl:    amoxicillin-clavulanate (AUGMENTIN) 875-125 MG tablet, Take 1 tablet by mouth 2 (two) times daily., Disp: 14 tablet, Rfl: 0   b complex vitamins capsule, Take 1 capsule by mouth daily., Disp: , Rfl:  budesonide-formoterol (SYMBICORT) 160-4.5 MCG/ACT inhaler, , Disp: , Rfl:    buPROPion (WELLBUTRIN XL) 150 MG 24 hr tablet, Take 150 mg by mouth daily., Disp: , Rfl:    buPROPion (WELLBUTRIN XL) 300 MG 24 hr tablet, Take 1 tablet (300 mg total) by mouth daily., Disp: 90 tablet, Rfl: 1   Cholecalciferol (VITAMIN D-3) 125 MCG (5000 UT) TABS, Take 1 tablet by mouth daily., Disp: , Rfl:    cloNIDine (CATAPRES) 0.1 MG tablet, Take 0.1 mg  by mouth daily as needed., Disp: , Rfl:    fexofenadine (ALLEGRA) 180 MG tablet, Take 180 mg by mouth daily., Disp: , Rfl:    FLUoxetine (PROZAC) 40 MG capsule, , Disp: , Rfl:    fluticasone (FLONASE) 50 MCG/ACT nasal spray, Place 2 sprays into both nostrils daily., Disp: 16 g, Rfl: 0   levocetirizine (XYZAL) 5 MG tablet, Take 1 tablet (5 mg total) by mouth every evening., Disp: 90 tablet, Rfl: 0   Multiple Vitamin tablet, Take 1 tablet by mouth daily., Disp: , Rfl:    Omega-3 Fatty Acids (FISH OIL) 1000 MG CAPS, Take 1 capsule by mouth daily., Disp: , Rfl:    pantoprazole (PROTONIX) 40 MG tablet, Take 1 tablet (40 mg total) by mouth daily., Disp: 90 tablet, Rfl: 1   predniSONE (DELTASONE) 50 MG tablet, Take 1 tablet (50 mg total) by mouth daily with breakfast., Disp: 5 tablet, Rfl: 0  Observations/Objective: Patient is well-developed, well-nourished in no acute distress.  Resting comfortably  at home.  Head is normocephalic, atraumatic.  No labored breathing.  Speech is clear and coherent with logical content.  Patient is alert and oriented at baseline.    Assessment and Plan: 1. Moderate persistent asthma, unspecified whether complicated  2. Dental abscess  Warm salt water rinses, ibuprofen as directed, follow up with dentist and allergist as discussed. Urgent care if sx worsen.   Follow Up Instructions: I discussed the assessment and treatment plan with the patient. The patient was provided an opportunity to ask questions and all were answered. The patient agreed with the plan and demonstrated an understanding of the instructions.  A copy of instructions were sent to the patient via MyChart unless otherwise noted below.     The patient was advised to call back or seek an in-person evaluation if the symptoms worsen or if the condition fails to improve as anticipated.  Time:  I spent 10 minutes with the patient via telehealth technology discussing the above problems/concerns.     Georgana Curio, FNP

## 2022-11-13 NOTE — Patient Instructions (Signed)
Dental Abscess  A dental abscess is an infection around a tooth that may involve pain, swelling, and a collection of pus, as well as other symptoms. Treatment is important to help with symptoms and to prevent the infection from spreading. The general types of dental abscesses are: Pulpal abscess. This abscess may form from the inner part of the tooth (pulp). Periodontal abscess. This abscess may form from the gum. What are the causes? This condition is caused by a bacterial infection in or around the tooth. It may result from: Severe tooth decay (cavities). Trauma to the tooth, such as a broken or chipped tooth. What increases the risk? This condition is more likely to develop in males. It is also more likely to develop in people who: Have cavities. Have severe gum disease. Eat sugary snacks between meals. Use tobacco products. Have diabetes. Have a weakened disease-fighting system (immune system). Do not brush and care for their teeth regularly. What are the signs or symptoms? Mild symptoms of this condition include: Tenderness. Bad breath. Fever. A bitter taste in the mouth. Pain in and around the infected tooth. Moderate symptoms of this condition include: Swollen neck glands. Chills. Pus drainage. Swelling and redness around the infected tooth, in the mouth, or in the face. Severe pain in and around the infected tooth. Severe symptoms of this condition include: Difficulty swallowing. Difficulty opening the mouth. Nausea. Vomiting. How is this diagnosed? This condition is diagnosed based on: Your symptoms and your medical and dental history. An examination of the infected tooth. During the exam, your dental care provider may tap on the infected tooth. You may also need to have X-rays taken of the affected area. How is this treated? This condition is treated by getting rid of the infection. This may be done with: Antibiotic medicines. These may be used in certain  situations. Antibacterial mouth rinse. Incision and drainage. This procedure is done by making an incision in the abscess to drain out the pus. Removing pus is the first priority in treating an abscess. A root canal. This may be performed to save the tooth. Your dental care provider accesses the visible part of your tooth (crown) with a drill and removes any infected pulp. Then the space is filled and sealed off. Tooth extraction. The tooth is pulled out if it cannot be saved by other treatment. You may also receive treatment for pain, such as: Acetaminophen or NSAIDs. Gels that contain a numbing medicine. An injection to block the pain near your nerve. Follow these instructions at home: Medicines Take over-the-counter and prescription medicines only as told by your dental care provider. If you were prescribed an antibiotic, take it as told by your dental care provider. Do not stop taking the antibiotic even if you start to feel better. If you were prescribed a gel that contains a numbing medicine, use it exactly as told in the directions. Do not use these gels for children who are younger than 2 years of age. Use an antibacterial mouth rinse as told by your dental care provider. General instructions  Gargle with a mixture of salt and water 3-4 times a day or as needed. To make salt water, completely dissolve -1 tsp (3-6 g) of salt in 1 cup (237 mL) of warm water. Eat a soft diet while your abscess is healing. Drink enough fluid to keep your urine pale yellow. Do not apply heat to the outside of your mouth. Do not use any products that contain nicotine or tobacco. These   products include cigarettes, chewing tobacco, and vaping devices, such as e-cigarettes. If you need help quitting, ask your dental care provider. Keep all follow-up visits. This is important. How is this prevented?  Excellent dental home care, which includes brushing your teeth every morning and night with fluoride  toothpaste. Floss one time each day. Get regularly scheduled dental cleanings. Consider having a dental sealant applied on teeth that have deep grooves to prevent cavities. Drink fluoridated water regularly. This includes most tap water. Check the label on bottled water to see if it contains fluoride. Reduce or eliminate sugary drinks. Eat healthy meals and snacks. Wear a mouth guard or face shield to protect your teeth while playing sports. Contact a health care provider if: Your pain is worse and is not helped by medicine. You have swelling. You see pus around the tooth. You have a fever or chills. Get help right away if: Your symptoms suddenly get worse. You have a very bad headache. You have problems breathing or swallowing. You have trouble opening your mouth. You have swelling in your neck or around your eye. These symptoms may represent a serious problem that is an emergency. Do not wait to see if the symptoms will go away. Get medical help right away. Call your local emergency services (911 in the [place] Do not drive yourself to the hospital. Summary A dental abscess is a collection of pus in or around a tooth that results from an infection. A dental abscess may result from severe tooth decay, trauma to the tooth, or severe gum disease around a tooth. Symptoms include severe pain, swelling, redness, and drainage of pus in and around the infected tooth. The first priority in treating a dental abscess is to drain out the pus. Treatment may also involve removing damage inside the tooth (root canal) or extracting the tooth. This information is not intended to replace advice given to you by your health care provider. Make sure you discuss any questions you have with your health care provider. Document Revised: 02/13/2021 Document Reviewed: 02/13/2021 Elsevier Patient Education  2023 [employer]  

## 2022-11-20 ENCOUNTER — Other Ambulatory Visit: Payer: Self-pay

## 2022-11-20 ENCOUNTER — Emergency Department (HOSPITAL_BASED_OUTPATIENT_CLINIC_OR_DEPARTMENT_OTHER)
Admission: EM | Admit: 2022-11-20 | Discharge: 2022-11-20 | Disposition: A | Discharge status: Home / Self Care | Payer: 59 | Attending: Emergency Medicine | Admitting: Emergency Medicine

## 2022-11-20 ENCOUNTER — Encounter (HOSPITAL_BASED_OUTPATIENT_CLINIC_OR_DEPARTMENT_OTHER): Payer: Self-pay | Admitting: *Deleted

## 2022-11-20 ENCOUNTER — Emergency Department (HOSPITAL_BASED_OUTPATIENT_CLINIC_OR_DEPARTMENT_OTHER): Payer: 59

## 2022-11-20 DIAGNOSIS — Z1152 Encounter for screening for COVID-19: Secondary | ICD-10-CM | POA: Insufficient documentation

## 2022-11-20 DIAGNOSIS — J45909 Unspecified asthma, uncomplicated: Secondary | ICD-10-CM | POA: Diagnosis not present

## 2022-11-20 DIAGNOSIS — R002 Palpitations: Secondary | ICD-10-CM | POA: Diagnosis present

## 2022-11-20 LAB — RESP PANEL BY RT-PCR (FLU A&B, COVID) ARPGX2
Influenza A by PCR: NEGATIVE
Influenza B by PCR: NEGATIVE
SARS Coronavirus 2 by RT PCR: NEGATIVE

## 2022-11-20 LAB — CBC
HCT: 38.9 % (ref 36.0–46.0)
Hemoglobin: 13.3 g/dL (ref 12.0–15.0)
MCH: 28.7 pg (ref 26.0–34.0)
MCHC: 34.2 g/dL (ref 30.0–36.0)
MCV: 83.8 fL (ref 80.0–100.0)
Platelets: 351 10*3/uL (ref 150–400)
RBC: 4.64 MIL/uL (ref 3.87–5.11)
RDW: 12.8 % (ref 11.5–15.5)
WBC: 10.8 10*3/uL — ABNORMAL HIGH (ref 4.0–10.5)
nRBC: 0 % (ref 0.0–0.2)

## 2022-11-20 LAB — BASIC METABOLIC PANEL
Anion gap: 11 (ref 5–15)
BUN: 14 mg/dL (ref 6–20)
CO2: 22 mmol/L (ref 22–32)
Calcium: 9 mg/dL (ref 8.9–10.3)
Chloride: 106 mmol/L (ref 98–111)
Creatinine, Ser: 0.84 mg/dL (ref 0.44–1.00)
GFR, Estimated: >60 mL/min (ref >60)
Glucose, Bld: 109 mg/dL — ABNORMAL HIGH (ref 70–99)
Potassium: 3.4 mmol/L — ABNORMAL LOW (ref 3.5–5.1)
Sodium: 139 mmol/L (ref 135–145)

## 2022-11-20 LAB — SARS CORONAVIRUS 2 BY RT PCR: SARS Coronavirus 2 by RT PCR: NEGATIVE

## 2022-11-20 LAB — TROPONIN I (HIGH SENSITIVITY): Troponin I (High Sensitivity): 2 ng/L (ref <18)

## 2022-11-20 MED ORDER — HYDROXYZINE HCL 25 MG PO TABS: 25.0000 mg | ORAL_TABLET | Freq: Four times a day (QID) | ORAL | 0 refills | Status: DC

## 2022-11-20 NOTE — ED Notes (Signed)
Patient annoyed that I asked her about providing a urine sample. Says I'm the third person to ask and that the provider told her that she doesn't need to.

## 2022-11-20 NOTE — ED Notes (Signed)
Patient also reports she had a sinus infection in October and then had a root canal 11/2.  She completed augmentin for both.

## 2022-11-20 NOTE — Discharge Instructions (Signed)
Please follow-up with your primary care provider.  I suspect that your symptoms are related to the medications that you are currently taking and the increased doses of medications as well as addition of Adderall to your medication regimen.  I have written you prescription for hydroxyzine which is a mild medication that can be used as needed as prescribed.  However ultimately hydration, rest, deep breathing, meditative thinking and self soothing can be helpful in this situation--as it was for me and the story I discussed with you.  Please follow-up with your psychiatrist and primary care doctor.  Return to emergency room for any new or concerning symptoms as we discussed

## 2022-11-20 NOTE — ED Triage Notes (Signed)
Patient reports for about a week she has noticed that her heart feels like it is palpating or "getting larger in her chest"  patient states she feels like she cannot take a deep breath when this occurs.  Patient states she has had some medications changes.  Her prozac was increased to 60 mg and she was started on symbacort in June.  Patient unsure if her sx are related to med changes.  She is alert. Skin is warm and dry.  She is able to speak in full sentences

## 2022-11-20 NOTE — ED Provider Notes (Signed)
[facility] EMERGENCY DEPARTMENT Provider Note   CSN: [id number] Arrival date & time: 11/20/22  1421     History  Chief Complaint  Patient presents with   Palpitations    Martha Ware is a 41 y.o. female.   Palpitations Patient is a 41 year old female with a past medical history significant for ADHD, asthma, depression, reflux, anxiety, allergies  She presented emergency room today with complaints of approximately 1.5 to 2 weeks of occasional episodic heart palpitations she states that she will feel like she cannot take a deep breath and she will feel jittery she will feel anxious and states that these episodes seem to occur after she uses her inhaled corticosteroid for her asthma.  Notably over this period of time she has started taking Adderall initially in a immediate release and now on extended release.  She also has recently taken some prednisone for her recent asthma exacerbation and her Prozac was increased.  She denies any chest pain difficulty breathing currently and states she has no symptoms at all presently.  Her symptoms do not seem to be exertional.  No other factors seem to cause her symptoms consistently other than the ICS.  No recent surgeries, hospitalization, long travel, hemoptysis, estrogen containing OCP, cancer history.  No unilateral leg swelling.  No history of PE or VTE.      Home Medications Prior to Admission medications   Medication Sig Start Date End Date Taking? Authorizing Provider  albuterol (VENTOLIN HFA) 108 (90 Base) MCG/ACT inhaler Inhale 2 puffs into the lungs every 6 (six) hours as needed.  09/20/20   [provider]  amoxicillin-clavulanate (AUGMENTIN) 875-125 MG tablet Take 1 tablet by mouth 2 (two) times daily. 10/12/22   Wallis Bamberg, PA-C  b complex vitamins capsule Take 1 capsule by mouth daily.    [provider]  budesonide-formoterol [facility]) 160-4.5 MCG/ACT inhaler  06/10/22   [provider]   buPROPion (WELLBUTRIN XL) 150 MG 24 hr tablet Take 150 mg by mouth daily. 07/22/22   [provider]  buPROPion (WELLBUTRIN XL) 300 MG 24 hr tablet Take 1 tablet (300 mg total) by mouth daily. 12/31/21   Christen Butter, NP  Cholecalciferol (VITAMIN D-3) 125 MCG (5000 UT) TABS Take 1 tablet by mouth daily.    [provider]  cloNIDine (CATAPRES) 0.1 MG tablet Take 0.1 mg by mouth daily as needed.    [provider]  fexofenadine (ALLEGRA) 180 MG tablet Take 180 mg by mouth daily.    [provider]  FLUoxetine (PROZAC) 40 MG capsule  03/21/22   [provider]  fluticasone (FLONASE) 50 MCG/ACT nasal spray Place 2 sprays into both nostrils daily. 10/20/21   Couture, Cortni S, PA-C  levocetirizine (XYZAL) 5 MG tablet Take 1 tablet (5 mg total) by mouth every evening. 10/12/22   Wallis Bamberg, PA-C  Multiple Vitamin tablet Take 1 tablet by mouth daily.    [provider]  Omega-3 Fatty Acids (FISH OIL) 1000 MG CAPS Take 1 capsule by mouth daily.    [provider]  pantoprazole (PROTONIX) 40 MG tablet Take 1 tablet (40 mg total) by mouth daily. 12/31/21   Christen Butter, NP  predniSONE (DELTASONE) 50 MG tablet Take 1 tablet (50 mg total) by mouth daily with breakfast. 10/12/22   Wallis Bamberg, PA-C      Allergies    Codeine    Review of Systems   Review of Systems  Cardiovascular:  Positive for palpitations.  Physical Exam Updated Vital Signs BP 116/79   Pulse 79   Temp 98.2 F (36.8 C)   Resp 12   SpO2 100%  Physical Exam Vitals and nursing note reviewed.  Constitutional:      General: She is not in acute distress.    Comments: Pleasant well-appearing 41 year old.  In no acute distress.  Sitting comfortably in bed.  Able answer questions appropriately follow commands. No increased work of breathing. Speaking in full sentences.   HENT:     Head: Normocephalic and atraumatic.     Nose: Nose normal.  Eyes:     General: No scleral  icterus. Cardiovascular:     Rate and Rhythm: Normal rate and regular rhythm.     Pulses: Normal pulses.     Heart sounds: Normal heart sounds.  Pulmonary:     Effort: Pulmonary effort is normal. No respiratory distress.     Breath sounds: No wheezing.  Abdominal:     Palpations: Abdomen is soft.     Tenderness: There is no abdominal tenderness.  Musculoskeletal:     Cervical back: Normal range of motion.     Right lower leg: No edema.     Left lower leg: No edema.  Skin:    General: Skin is warm and dry.     Capillary Refill: Capillary refill takes less than 2 seconds.  Neurological:     Mental Status: She is alert. Mental status is at baseline.  Psychiatric:        Mood and Affect: Mood normal.        Behavior: Behavior normal.     ED Results / Procedures / Treatments   Labs (all labs ordered are listed, but only abnormal results are displayed) Labs Reviewed  BASIC METABOLIC PANEL - Abnormal; Notable for the following components:      Result Value   Potassium 3.4 (*)    Glucose, Bld 109 (*)    All other components within normal limits  CBC - Abnormal; Notable for the following components:   WBC 10.8 (*)    All other components within normal limits  SARS CORONAVIRUS 2 BY RT PCR  RESP PANEL BY RT-PCR (FLU A&B, COVID) ARPGX2  PREGNANCY, URINE  TROPONIN I (HIGH SENSITIVITY)    EKG EKG Interpretation  Date/Time:  Friday November 20 2022 14:36:24 EST Ventricular Rate:  85 PR Interval:  112 QRS Duration: 82 QT Interval:  388 QTC Calculation: 461 R Axis:   67 Text Interpretation: Normal sinus rhythm Normal ECG No previous ECGs available Confirmed by Kommor, Madison ([id number]) on 11/20/2022 2:44:36 PM  Radiology DG Chest 2 View  Result Date: 11/20/2022 CLINICAL DATA:  Sensation of chest fullness for 1 week. EXAM: CHEST - 2 VIEW COMPARISON:  None Available. FINDINGS: The heart size and mediastinal contours are normal. The lungs are clear. There is no pleural effusion or  pneumothorax. No acute osseous findings are identified. IMPRESSION: No active cardiopulmonary process. Electronically Signed   By: Carey Bullocks M.D.   On: 11/20/2022 15:02    Procedures Procedures    Medications Ordered in ED Medications - No data to display  ED Course/ Medical Decision Making/ A&P Clinical Course as of 11/20/22 1813  Fri Nov 20, 2022  1755 DG Chest 2 View NML [WF]  1755 Resp Panel by RT-PCR (Flu A&B, Covid) Anterior Nasal Swab Neg [WF]    Clinical Course User Index [WF] Gailen Shelter, [place]  Medical Decision Making Amount and/or Complexity of Data Reviewed Labs: ordered. Decision-making details documented in ED Course. Radiology: ordered. Decision-making details documented in ED Course.   This patient presents to the ED for concern of palpitations, this involves a number of treatment options, and is a complaint that carries with it a moderate to high risk of complications and morbidity. A differential diagnosis was considered for the patient's symptoms which is discussed below:   The differential diagnosis for palpitations includes cardiac arrhythmias, PVC/PAC, ACS, Cardiomyopathy, CHF, MVP, pericarditis, valvular disease, Panic/Anxiety, Somatic disorder, ETOH, Caffeine,  Stimulant use, medication side effect, Anemia, Hyperthyroidism, pulmonary embolism.    Co morbidities: Discussed in HPI   Brief History:  Patient is a 41 year old female with a past medical history significant for ADHD, asthma, depression, reflux, anxiety, allergies  She presented emergency room today with complaints of approximately 1.5 to 2 weeks of occasional episodic heart palpitations she states that she will feel like she cannot take a deep breath and she will feel jittery she will feel anxious and states that these episodes seem to occur after she uses her inhaled corticosteroid for her asthma.  Notably over this period of time she has started taking  Adderall initially in a immediate release and now on extended release.  She also has recently taken some prednisone for her recent asthma exacerbation and her Prozac was increased.  She denies any chest pain difficulty breathing currently and states she has no symptoms at all presently.  Her symptoms do not seem to be exertional.  No other factors seem to cause her symptoms consistently other than the ICS.  No recent surgeries, hospitalization, long travel, hemoptysis, estrogen containing OCP, cancer history.  No unilateral leg swelling.  No history of PE or VTE.        EMR reviewed including pt PMHx, past surgical history and past visits to ER.   See HPI for more details   Lab Tests:   I ordered and independently interpreted labs. Labs notable for Patient declined urine pregnancy test she states there is no chance that she could be pregnant.  She states she is not sexually active.  Troponin within normal limits x 1 CBC with very mild leukocytosis hemoglobin higher end of normal perhaps some hemoconcentration I do not see any evidence of infection on my physical exam or indication of this on the history  COVID influenza negative  Imaging Studies:  NAD. I personally reviewed all imaging studies and no acute abnormality found. I agree with radiology interpretation.    Cardiac Monitoring:  The patient was maintained on a cardiac monitor.  I personally viewed and interpreted the cardiac monitored which showed an underlying rhythm of: SR EKG non-ischemic   Medicines ordered:  No symptoms, no medications ordered   Critical Interventions:     Consults/Attending Physician      Reevaluation:  After the interventions noted above I re-evaluated patient and found that they have :stayed the same   Social Determinants of Health:      Problem List / ED Course:  Palpitations.  Notably patient has been started on Adderall over the same time.  She has been having  palpitations and is already on Prozac which was also increased recently as well.  She is no longer on her Wellbutrin.  Her labs are generally reassuring.  She has no symptoms currently and my exam is reassuring as well.  I recommend that she follow-up with her primary care provider and psychiatrist.  Ultimately I  think that she will benefit from some additional consideration of her medications and some reassurance Mild hypokalemia recommend bananas, recheck with PCP   Dispostion:  After consideration of the diagnostic results and the patients response to treatment, I feel that the patent would benefit from primary care follow-up.  Return precautions were discussed.  Final Clinical Impression(s) / ED Diagnoses Final diagnoses:  Palpitations    Rx / DC Orders ED Discharge Orders     None         Gailen ShelterFondaw, Jona Erkkila S, [place] 11/20/22 1931    Maia PlanLong, Joshua G, MD 11/26/22 1504

## 2022-11-23 ENCOUNTER — Telehealth: Payer: Self-pay | Admitting: General Practice

## 2022-11-23 NOTE — Telephone Encounter (Signed)
Transition Care Management Unsuccessful Follow-up Telephone Call  Date of discharge and from where:  11/20/22 from [facility]  Attempts:  1st Attempt  Reason for unsuccessful TCM follow-up call:  Left voice message

## 2022-11-24 NOTE — Telephone Encounter (Signed)
Transition Care Management Unsuccessful Follow-up Telephone Call  Date of discharge and from where:  11/20/22 from [facility]  Attempts:  2nd Attempt  Reason for unsuccessful TCM follow-up call:  No answer/busy

## 2022-11-25 NOTE — Telephone Encounter (Signed)
Transition Care Management Unsuccessful Follow-up Telephone Call  Date of discharge and from where:  11/20/22 from [facility]  Attempts:  3rd Attempt  Reason for unsuccessful TCM follow-up call:  No answer/busy

## 2022-12-02 NOTE — Progress Notes (Shared)
[facility] - Clinic Note  12/04/2022     CHIEF COMPLAINT Patient presents for No chief complaint on file.  HISTORY OF PRESENT ILLNESS: Martha Ware is a 41 y.o. female who presents to the clinic today for:    Pt is here on the referral of Dr. 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 [place],  [place] [address]  HISTORICAL INFORMATION:   Selected notes from the MEDICAL RECORD NUMBER Referred by Dr. Zenaida Niece  LEE: 04/23/22 Ocular Hx-Blind spot OD, PVD OU, Glc suspect PMH- Anxiety/depression, Reflux    CURRENT MEDICATIONS: No current outpatient medications on file. (Ophthalmic Drugs)   No current facility-administered medications for this visit. (Ophthalmic Drugs)   Current Outpatient Medications (Other)  Medication Sig   albuterol (VENTOLIN HFA) 108 (90 Base) MCG/ACT inhaler Inhale 2 puffs into the lungs every 6 (six) hours as needed.    amoxicillin-clavulanate (AUGMENTIN) 875-125 MG tablet Take 1 tablet by mouth 2 (two) times daily.   b complex vitamins capsule Take 1 capsule by mouth daily.   budesonide-formoterol (SYMBICORT) 160-4.5 MCG/ACT inhaler    buPROPion (WELLBUTRIN XL) 150 MG 24 hr tablet Take 150 mg by mouth daily.   buPROPion (WELLBUTRIN XL) 300 MG 24 hr tablet Take 1 tablet (300 mg total) by mouth daily.   Cholecalciferol (VITAMIN D-3) 125 MCG (5000 UT) TABS Take 1 tablet by mouth daily.   cloNIDine (CATAPRES) 0.1 MG tablet Take 0.1 mg by mouth  daily as needed.   fexofenadine (ALLEGRA) 180 MG tablet Take 180 mg by mouth daily.   FLUoxetine (PROZAC) 40 MG capsule    fluticasone (FLONASE) 50 MCG/ACT nasal spray Place 2 sprays into both nostrils daily.   hydrOXYzine (ATARAX) 25 MG tablet Take 1 tablet (25 mg total) by mouth every 6 (six) hours.   levocetirizine (XYZAL) 5 MG tablet Take 1 tablet (5 mg total) by mouth every evening.   Multiple Vitamin tablet Take 1 tablet by mouth daily.   Omega-3 Fatty Acids (FISH OIL) 1000 MG CAPS Take 1 capsule by mouth daily.   pantoprazole (PROTONIX) 40 MG tablet Take 1 tablet (40 mg total) by mouth daily.   predniSONE (DELTASONE) 50 MG tablet Take 1 tablet (50 mg total) by mouth daily with breakfast.   No current facility-administered medications for this visit. (Other)   REVIEW OF SYSTEMS:   ALLERGIES Allergies  Allergen Reactions   Codeine Nausea And Vomiting   PAST MEDICAL HISTORY Past Medical History:  Diagnosis Date   Allergy    Anxiety    Asthma    Depression    GERD (gastroesophageal reflux disease)    Hx of abnormal cervical Pap smear 2006   Hx of cholecystectomy    Past Surgical History:  Procedure Laterality Date   CHOLECYSTECTOMY     COLPOSCOPY W/ BIOPSY / CURETTAGE  2006   LYMPHADENECTOMY  TONSILLECTOMY     FAMILY HISTORY Family History  Problem Relation Age of Onset   Hypertension Mother    Endometrial cancer Mother    Healthy Father    Hypothyroidism Maternal Grandmother    Alcohol abuse Maternal Grandfather    SOCIAL HISTORY Social History   Tobacco Use   Smoking status: Every Day    Types: E-cigarettes   Smokeless tobacco: Never  Vaping Use   Vaping Use: Every day  Substance Use Topics   Alcohol use: Yes    Alcohol/week: 6.0 standard drinks of alcohol    Types: 6 Glasses of wine per week   Drug use: Yes    Frequency: 4.0 times per week    Types: Marijuana       OPHTHALMIC EXAM:  Not recorded    IMAGING AND PROCEDURES  Imaging and  Procedures for 12/04/2022          ASSESSMENT/PLAN:    ICD-10-CM   1. 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 Hypertensive retinopathy of both eyes  H35.033      1,2. 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 - BCVA remains 20/20 - unclear etiology of focal atrophy - may benefit from formal HVF testing to quantify scotoma - discussed findings - no retinal or ophthalmic interventions indicated or recommended  - recommend monitoring   3. Mild retinoschisis OD  - tr cystic changes nasal macula  - FA 08.15.23 shows no staining or leakage corresponding to cystic changes so suspect retinoschisis  - monitor  4,5. 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    No follow-ups on file.  There are no Patient Instructions on file for this visit.   Explained the diagnoses, plan, and follow up with the patient and they expressed understanding.  Patient expressed understanding of the importance of proper follow up care.   This document serves as a record of services personally performed by Karie Chimera, MD, PhD. It was created on  their behalf by Glee Arvin. Manson Passey, OA an ophthalmic technician. The creation of this record is the provider's dictation and/or activities during the visit.    Electronically signed by: Glee Arvin. Manson Passey, [place] 12.13.2023 7:58 AM   Karie Chimera, M.D., Ph.D. Diseases & Surgery of the Retina and Vitreous [facility]    Abbreviations: M myopia (nearsighted); A astigmatism; H hyperopia (farsighted); P presbyopia; Mrx spectacle prescription;  CTL contact lenses; OD right eye; OS left eye; OU both eyes  XT exotropia; ET esotropia; PEK  punctate epithelial keratitis; PEE punctate epithelial erosions; DES dry eye syndrome; MGD meibomian gland dysfunction; ATs artificial tears; PFAT's preservative free artificial tears; NSC nuclear sclerotic cataract; PSC posterior subcapsular cataract; ERM epi-retinal membrane; PVD posterior vitreous detachment; RD retinal detachment; DM diabetes mellitus; DR diabetic retinopathy; NPDR non-proliferative diabetic retinopathy; PDR proliferative diabetic retinopathy; CSME clinically significant macular edema; DME diabetic macular edema; dbh dot blot hemorrhages; CWS cotton wool spot; POAG primary open angle glaucoma; C/D cup-to-disc ratio; HVF humphrey visual field; GVF goldmann visual field; OCT optical coherence tomography; IOP intraocular pressure; BRVO Branch retinal vein occlusion; CRVO central retinal vein occlusion; CRAO central retinal artery occlusion; BRAO branch retinal artery occlusion; RT retinal tear; SB scleral buckle; PPV pars plana vitrectomy; VH Vitreous hemorrhage; PRP panretinal laser photocoagulation; IVK intravitreal kenalog; VMT vitreomacular traction; MH Macular hole;  NVD neovascularization of the disc; NVE neovascularization elsewhere; AREDS age related eye disease study; ARMD age related macular degeneration; POAG primary open angle glaucoma; EBMD epithelial/anterior basement membrane dystrophy; ACIOL anterior chamber intraocular lens;  IOL intraocular lens; PCIOL posterior chamber intraocular lens; Phaco/IOL phacoemulsification with intraocular lens placement; PRK photorefractive keratectomy; LASIK laser assisted in situ keratomileusis; HTN hypertension; DM diabetes mellitus; COPD chronic obstructive pulmonary disease

## 2022-12-04 ENCOUNTER — Encounter (INDEPENDENT_AMBULATORY_CARE_PROVIDER_SITE_OTHER): Payer: 59 | Admitting: Ophthalmology

## 2022-12-04 ENCOUNTER — Encounter (INDEPENDENT_AMBULATORY_CARE_PROVIDER_SITE_OTHER): Payer: Self-pay

## 2022-12-22 NOTE — Progress Notes (Shared)
[facility] Note  01/05/2023     CHIEF COMPLAINT Patient presents for No chief complaint on file.  HISTORY OF PRESENT ILLNESS: Martha Ware is a 42 y.o. female who presents to the clinic today for:    Pt is here on the referral of Dr. Lucianne Lei for concern of scotoma in the right eye, pt states she is unsure if she had it before she got pregnant, but she noticed it when she was pregnat with her daughter in 2018, she states she went to see a dr in [place] at that time and they told her she was fine, when pt moved here, she had a hard time passing the eye test to get her driver's licence, she states the spot in her eye is bigger than it was 4 years ago, pts father has optic nerve pits and a "lengthy eye hx" Per pt   Referring physician: Samuel Bouche, NP [address] [place],  [place] [address]  HISTORICAL INFORMATION:   Selected notes from the [facility] Referred by Dr. 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 (Ophthalmic Drugs)   No current facility-administered medications for this visit. (Ophthalmic Drugs)   Current Outpatient Medications (Other)  Medication Sig   albuterol (VENTOLIN HFA) 108 (90 Base) MCG/ACT inhaler Inhale 2 puffs into the lungs every 6 (six) hours as needed.    amoxicillin-clavulanate (AUGMENTIN) 875-125 MG tablet Take 1 tablet by mouth 2 (two) times daily.   b complex vitamins capsule Take 1 capsule by mouth daily.   budesonide-formoterol (SYMBICORT) 160-4.5 MCG/ACT inhaler    buPROPion (WELLBUTRIN XL) 150 MG 24 hr tablet Take 150 mg by mouth daily.   buPROPion (WELLBUTRIN XL) 300 MG 24 hr tablet Take 1 tablet (300 mg total) by mouth daily.   Cholecalciferol (VITAMIN D-3) 125 MCG (5000 UT) TABS Take 1 tablet by mouth daily.   cloNIDine (CATAPRES) 0.1 MG tablet Take 0.1 mg by mouth  daily as needed.   fexofenadine (ALLEGRA) 180 MG tablet Take 180 mg by mouth daily.   FLUoxetine (PROZAC) 40 MG capsule    fluticasone (FLONASE) 50 MCG/ACT nasal spray Place 2 sprays into both nostrils daily.   hydrOXYzine (ATARAX) 25 MG tablet Take 1 tablet (25 mg total) by mouth every 6 (six) hours.   levocetirizine (XYZAL) 5 MG tablet Take 1 tablet (5 mg total) by mouth every evening.   Multiple Vitamin tablet Take 1 tablet by mouth daily.   Omega-3 Fatty Acids (FISH OIL) 1000 MG CAPS Take 1 capsule by mouth daily.   pantoprazole (PROTONIX) 40 MG tablet Take 1 tablet (40 mg total) by mouth daily.   predniSONE (DELTASONE) 50 MG tablet Take 1 tablet (50 mg total) by mouth daily with breakfast.   No current facility-administered medications for this visit. (Other)   REVIEW OF SYSTEMS:   ALLERGIES Allergies  Allergen Reactions   Codeine Nausea And Vomiting   PAST MEDICAL HISTORY Past Medical History:  Diagnosis Date   Allergy    Anxiety    Asthma    Depression    GERD (gastroesophageal reflux disease)    Hx of abnormal cervical Pap smear 2006   Hx of cholecystectomy    Past Surgical History:  Procedure Laterality Date   CHOLECYSTECTOMY     COLPOSCOPY W/ BIOPSY / CURETTAGE  2006   LYMPHADENECTOMY  TONSILLECTOMY     FAMILY HISTORY Family History  Problem Relation Age of Onset   Hypertension Mother    Endometrial cancer Mother    Healthy Father    Hypothyroidism Maternal Grandmother    Alcohol abuse Maternal Grandfather    SOCIAL HISTORY Social History   Tobacco Use   Smoking status: Every Day    Types: E-cigarettes   Smokeless tobacco: Never  Vaping Use   Vaping Use: Every day  Substance Use Topics   Alcohol use: Yes    Alcohol/week: 6.0 standard drinks of alcohol    Types: 6 Glasses of wine per week   Drug use: Yes    Frequency: 4.0 times per week    Types: Marijuana       OPHTHALMIC EXAM:  Not recorded    IMAGING AND PROCEDURES  Imaging and  Procedures for 01/05/2023          ASSESSMENT/PLAN:    ICD-10-CM   1. 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 - BCVA remains 20/20 - unclear etiology of focal atrophy - may benefit from formal HVF testing to quantify scotoma - discussed findings - no retinal or ophthalmic interventions indicated or recommended  - recommend monitoring   3. Mild retinoschisis OD  - tr cystic changes nasal macula  - FA 08.15.23 shows no staining or leakage corresponding to cystic changes so suspect retinoschisis  - monitor  4,5. Anomalous optic disc / optic disc pit OD  - pt reports +FH w/ father having bilateral optic pits  - discussed abnormal optic disc findings  - recommend evaluation by Neuro-Ophthalmology -- will refer to [facility] for further evaluation and management  Ophthalmic Meds Ordered this visit:  No orders of the defined types were placed in this encounter.    No follow-ups on file.  There are no Patient Instructions on file for this visit.   Explained the diagnoses, plan, and follow up with the patient and they expressed understanding.  Patient expressed understanding of the importance of proper follow up care.   This document serves as a record of services personally performed by Gardiner Sleeper, MD, PhD. It was created on their behalf by Renaldo Reel, [place] an ophthalmic  technician. The creation of this record is the provider's dictation and/or activities during the visit.    Electronically signed by:  Renaldo Reel, COT  01.02.24 8:57 AM  Gardiner Sleeper, M.D., Ph.D. Diseases & Surgery of the Retina and Vitreous [facility]: M myopia (nearsighted); A astigmatism; H hyperopia (farsighted); P presbyopia; Mrx spectacle prescription;  CTL contact lenses; OD right eye; OS left eye; OU both eyes  XT exotropia; ET esotropia; PEK punctate epithelial keratitis; PEE punctate epithelial erosions; DES dry eye syndrome; MGD meibomian  gland dysfunction; ATs artificial tears; PFAT's preservative free artificial tears; NSC nuclear sclerotic cataract; PSC posterior subcapsular cataract; ERM epi-retinal membrane; PVD posterior vitreous detachment; RD retinal detachment; DM diabetes mellitus; DR diabetic retinopathy; NPDR non-proliferative diabetic retinopathy; PDR proliferative diabetic retinopathy; CSME clinically significant macular edema; DME diabetic macular edema; dbh dot blot hemorrhages; CWS cotton wool spot; POAG primary open angle glaucoma; C/D cup-to-disc ratio; HVF humphrey visual field; GVF goldmann visual field; OCT optical coherence tomography; IOP intraocular pressure; BRVO Branch retinal vein occlusion; CRVO central retinal vein occlusion; CRAO central retinal artery occlusion; BRAO branch retinal artery occlusion; RT retinal tear; SB scleral buckle; PPV pars plana vitrectomy; VH Vitreous hemorrhage; PRP panretinal laser photocoagulation; IVK intravitreal kenalog; VMT vitreomacular traction; MH Macular hole;  NVD neovascularization of the disc; NVE neovascularization elsewhere; AREDS age related eye disease study; ARMD age related macular degeneration; POAG primary open angle glaucoma; EBMD epithelial/anterior basement membrane dystrophy; ACIOL anterior chamber intraocular lens; IOL intraocular lens; PCIOL posterior chamber  intraocular lens; Phaco/IOL phacoemulsification with intraocular lens placement; PRK photorefractive keratectomy; LASIK laser assisted in situ keratomileusis; HTN hypertension; DM diabetes mellitus; COPD chronic obstructive pulmonary disease

## 2023-01-05 ENCOUNTER — Encounter (INDEPENDENT_AMBULATORY_CARE_PROVIDER_SITE_OTHER): Payer: 59 | Admitting: Ophthalmology

## 2023-01-05 DIAGNOSIS — Q078 Other specified congenital malformations of nervous system: Secondary | ICD-10-CM

## 2023-01-05 DIAGNOSIS — H3589 Other specified retinal disorders: Secondary | ICD-10-CM

## 2023-01-05 DIAGNOSIS — H53411 Scotoma involving central area, right eye: Secondary | ICD-10-CM

## 2023-01-05 DIAGNOSIS — H47391 Other disorders of optic disc, right eye: Secondary | ICD-10-CM

## 2023-01-05 DIAGNOSIS — H33101 Unspecified retinoschisis, right eye: Secondary | ICD-10-CM

## 2023-09-23 ENCOUNTER — Ambulatory Visit (INDEPENDENT_AMBULATORY_CARE_PROVIDER_SITE_OTHER): Payer: 59 | Admitting: Medical-Surgical

## 2023-09-23 ENCOUNTER — Other Ambulatory Visit (HOSPITAL_COMMUNITY)
Admission: RE | Admit: 2023-09-23 | Discharge: 2023-09-23 | Disposition: A | Discharge status: Home / Self Care | Payer: 59 | Source: Ambulatory Visit | Attending: Medical-Surgical | Admitting: Medical-Surgical

## 2023-09-23 VITALS — BP 112/79 | HR 78 | Resp 20 | Ht 68.0 in | Wt 224.1 lb

## 2023-09-23 DIAGNOSIS — Z0000 Encounter for general adult medical examination without abnormal findings: Secondary | ICD-10-CM | POA: Diagnosis not present

## 2023-09-23 DIAGNOSIS — H60311 Diffuse otitis externa, right ear: Secondary | ICD-10-CM

## 2023-09-23 DIAGNOSIS — Z124 Encounter for screening for malignant neoplasm of cervix: Secondary | ICD-10-CM | POA: Diagnosis present

## 2023-09-23 DIAGNOSIS — Q078 Other specified congenital malformations of nervous system: Secondary | ICD-10-CM | POA: Diagnosis not present

## 2023-09-23 DIAGNOSIS — K219 Gastro-esophageal reflux disease without esophagitis: Secondary | ICD-10-CM

## 2023-09-23 DIAGNOSIS — Z1231 Encounter for screening mammogram for malignant neoplasm of breast: Secondary | ICD-10-CM

## 2023-09-23 DIAGNOSIS — Z23 Encounter for immunization: Secondary | ICD-10-CM

## 2023-09-23 DIAGNOSIS — L290 Pruritus ani: Secondary | ICD-10-CM | POA: Diagnosis not present

## 2023-09-23 DIAGNOSIS — Z13220 Encounter for screening for lipoid disorders: Secondary | ICD-10-CM

## 2023-09-23 MED ORDER — CIPROFLOXACIN-DEXAMETHASONE 0.3-0.1 % OT SUSP: 4.0000 [drp] | Freq: Two times a day (BID) | OTIC | 0 refills | Status: AC

## 2023-09-23 MED ORDER — PANTOPRAZOLE SODIUM 40 MG PO TBEC: 40.0000 mg | DELAYED_RELEASE_TABLET | Freq: Every day | ORAL | 1 refills | Status: DC

## 2023-09-23 MED ORDER — CLOTRIMAZOLE-BETAMETHASONE 1-0.05 % EX CREA: 1.0000 | TOPICAL_CREAM | Freq: Two times a day (BID) | CUTANEOUS | 0 refills | Status: AC | PRN

## 2023-09-23 NOTE — Patient Instructions (Addendum)
Otitis externa of the right ear- start Ciprodex 4 drops to the right ear twice daily for 7 days. Avoid ear buds/headphones and excess water in the ear during treatment.   For perianal itching- try Lotrisone topically twice daily as needed for up to 14 days consecutively then take a 2 week break.   Preventive Care 21-41 Years Old, Female Preventive care refers to lifestyle choices and visits with your health care provider that can promote health and wellness. Preventive care visits are also called wellness exams. What can I expect for my preventive care visit? Counseling Your health care provider may ask you questions about your: Medical history, including: Past medical problems. Family medical history. Pregnancy history. Current health, including: Menstrual cycle. Method of birth control. Emotional well-being. Home life and relationship well-being. Sexual activity and sexual health. Lifestyle, including: Alcohol, nicotine or tobacco, and drug use. Access to firearms. Diet, exercise, and sleep habits. Work and work Astronomer. Sunscreen use. Safety issues such as seatbelt and bike helmet use. Physical exam Your health care provider will check your: Height and weight. These may be used to calculate your BMI (body mass index). BMI is a measurement that tells if you are at a healthy weight. Waist circumference. This measures the distance around your waistline. This measurement also tells if you are at a healthy weight and may help predict your risk of certain diseases, such as type 2 diabetes and high blood pressure. Heart rate and blood pressure. Body temperature. Skin for abnormal spots. What immunizations do I need?  Vaccines are usually given at various ages, according to a schedule. Your health care provider will recommend vaccines for you based on your age, medical history, and lifestyle or other factors, such as travel or where you work. What tests do I need? Screening Your  health care provider may recommend screening tests for certain conditions. This may include: Lipid and cholesterol levels. Diabetes screening. This is done by checking your blood sugar (glucose) after you have not eaten for a while (fasting). Pelvic exam and Pap test. Hepatitis B test. Hepatitis C test. HIV (human immunodeficiency virus) test. STI (sexually transmitted infection) testing, if you are at risk. Lung cancer screening. Colorectal cancer screening. Mammogram. Talk with your health care provider about when you should start having regular mammograms. This may depend on whether you have a family history of breast cancer. BRCA-related cancer screening. This may be done if you have a family history of breast, ovarian, tubal, or peritoneal cancers. Bone density scan. This is done to screen for osteoporosis. Talk with your health care provider about your test results, treatment options, and if necessary, the need for more tests. Follow these instructions at home: Eating and drinking  Eat a diet that includes fresh fruits and vegetables, whole grains, lean protein, and low-fat dairy products. Take vitamin and mineral supplements as recommended by your health care provider. Do not drink alcohol if: Your health care provider tells you not to drink. You are pregnant, may be pregnant, or are planning to become pregnant. If you drink alcohol: Limit how much you have to 0-1 drink a day. Know how much alcohol is in your drink. In the [place] one drink equals one 12 oz bottle of beer (355 mL), one 5 oz glass of wine (148 mL), or one 1 oz glass of hard liquor (44 mL). Lifestyle Brush your teeth every morning and night with fluoride toothpaste. Floss one time each day. Exercise for at least 30 minutes 5 or more  days each week. Do not use any products that contain nicotine or tobacco. These products include cigarettes, chewing tobacco, and vaping devices, such as e-cigarettes. If you need help  quitting, ask your health care provider. Do not use drugs. If you are sexually active, practice safe sex. Use a condom or other form of protection to prevent STIs. If you do not wish to become pregnant, use a form of birth control. If you plan to become pregnant, see your health care provider for a prepregnancy visit. Take aspirin only as told by your health care provider. Make sure that you understand how much to take and what form to take. Work with your health care provider to find out whether it is safe and beneficial for you to take aspirin daily. Find healthy ways to manage stress, such as: Meditation, yoga, or listening to music. Journaling. Talking to a trusted person. Spending time with friends and family. Minimize exposure to UV radiation to reduce your risk of skin cancer. Safety Always wear your seat belt while driving or riding in a vehicle. Do not drive: If you have been drinking alcohol. Do not ride with someone who has been drinking. When you are tired or distracted. While texting. If you have been using any mind-altering substances or drugs. Wear a helmet and other protective equipment during sports activities. If you have firearms in your house, make sure you follow all gun safety procedures. Seek help if you have been physically or sexually abused. What's next? Visit your health care provider once a year for an annual wellness visit. Ask your health care provider how often you should have your eyes and teeth checked. Stay up to date on all vaccines. This information is not intended to replace advice given to you by your health care provider. Make sure you discuss any questions you have with your health care provider. Document Revised: 06/04/2021 Document Reviewed: 06/04/2021 Elsevier Patient Education  2024 [employer].

## 2023-09-23 NOTE — Progress Notes (Signed)
Complete physical exam  Patient: Martha Ware   DOB: 1981-01-04   42 y.o. Female  MRN: [id number]  Subjective:    Chief Complaint  Patient presents with   Annual Exam   Gynecologic Exam    Martha Ware is a 42 y.o. female who presents today for a complete physical exam. She reports consuming a general diet. The patient does not participate in regular exercise at present. She generally feels fairly well. She reports sleeping fairly well. She does not have additional problems to discuss today.    Most recent fall risk assessment:    09/23/2023    1:55 PM  Fall Risk   Falls in the past year? 1  Number falls in past yr: 0  Injury with Fall? 1  Risk for fall due to : History of fall(s)  Follow up Falls evaluation completed     Most recent depression screenings:    09/23/2023    1:55 PM 02/05/2022    3:38 PM  PHQ 2/9 Scores  PHQ - 2 Score 2 2  PHQ- 9 Score 10 10    Vision:Within last year, Dental: No current dental problems and Receives regular dental care, and STD: The patient denies history of sexually transmitted disease.    Patient Care Team: Christen Butter, NP as PCP - General (Nurse Practitioner)   Outpatient Medications Prior to Visit  Medication Sig   albuterol (VENTOLIN HFA) 108 (90 Base) MCG/ACT inhaler Inhale 2 puffs into the lungs every 6 (six) hours as needed.    b complex vitamins capsule Take 1 capsule by mouth daily.   budesonide-formoterol (SYMBICORT) 160-4.5 MCG/ACT inhaler    Cholecalciferol (VITAMIN D-3) 125 MCG (5000 UT) TABS Take 1 tablet by mouth daily.   cloNIDine (CATAPRES) 0.1 MG tablet Take 0.1 mg by mouth daily as needed.   FLUoxetine (PROZAC) 40 MG capsule    fluticasone (FLONASE) 50 MCG/ACT nasal spray Place 2 sprays into both nostrils daily.   hydrOXYzine (ATARAX) 25 MG tablet Take 1 tablet (25 mg total) by mouth every 6 (six) hours.   levocetirizine (XYZAL) 5 MG tablet Take 1 tablet (5 mg total) by mouth every evening.   Multiple  Vitamin tablet Take 1 tablet by mouth daily.   Omega-3 Fatty Acids (FISH OIL) 1000 MG CAPS Take 1 capsule by mouth daily.   [DISCONTINUED] amoxicillin-clavulanate (AUGMENTIN) 875-125 MG tablet Take 1 tablet by mouth 2 (two) times daily.   [DISCONTINUED] buPROPion (WELLBUTRIN XL) 150 MG 24 hr tablet Take 150 mg by mouth daily.   [DISCONTINUED] buPROPion (WELLBUTRIN XL) 300 MG 24 hr tablet Take 1 tablet (300 mg total) by mouth daily.   [DISCONTINUED] fexofenadine (ALLEGRA) 180 MG tablet Take 180 mg by mouth daily.   [DISCONTINUED] pantoprazole (PROTONIX) 40 MG tablet Take 1 tablet (40 mg total) by mouth daily.   [DISCONTINUED] predniSONE (DELTASONE) 50 MG tablet Take 1 tablet (50 mg total) by mouth daily with breakfast.   No facility-administered medications prior to visit.    Review of Systems  Constitutional:  Negative for chills, fever, malaise/fatigue and weight loss.  HENT:  Positive for tinnitus (intermittent, right ear). Negative for congestion, ear pain, hearing loss, sinus pain and sore throat.   Eyes:  Negative for blurred vision, photophobia and pain.  Respiratory:  Negative for cough, shortness of breath and wheezing.   Cardiovascular:  Negative for chest pain, palpitations and leg swelling.  Gastrointestinal:  Positive for nausea. Negative for abdominal pain, constipation, diarrhea, heartburn and  vomiting.  Genitourinary:  Negative for dysuria, frequency and urgency.  Musculoskeletal:  Negative for falls and neck pain.  Skin:  Positive for itching (Right ear, perianal). Negative for rash.  Neurological:  Negative for dizziness, weakness and headaches.  Endo/Heme/Allergies:  Negative for polydipsia. Does not bruise/bleed easily.  Psychiatric/Behavioral:  Positive for depression. Negative for substance abuse and suicidal ideas. The patient is nervous/anxious and has insomnia.      Objective:    BP 112/79 (BP Location: Left Arm, Cuff Size: Large)   Pulse 78   Resp 20   Ht 5\' 8"   (1.727 m)   Wt 224 lb 1.6 oz (101.7 kg)   SpO2 98%   BMI 34.07 kg/m    Physical Exam Vitals reviewed.  Constitutional:      General: She is not in acute distress.    Appearance: Normal appearance. She is obese. She is not ill-appearing.  HENT:     Head: Normocephalic and atraumatic.     Right Ear: Tympanic membrane, ear canal and external ear normal. There is no impacted cerumen.     Left Ear: Tympanic membrane, ear canal and external ear normal. There is no impacted cerumen.     Nose: Nose normal. No congestion or rhinorrhea.     Mouth/Throat:     Mouth: Mucous membranes are moist.     Pharynx: No oropharyngeal exudate or posterior oropharyngeal erythema.  Eyes:     General: No scleral icterus.       Right eye: No discharge.        Left eye: No discharge.     Extraocular Movements: Extraocular movements intact.     Conjunctiva/sclera: Conjunctivae normal.     Pupils: Pupils are equal, round, and reactive to light.  Neck:     Thyroid: No thyromegaly.     Vascular: No carotid bruit or JVD.     Trachea: Trachea normal.  Cardiovascular:     Rate and Rhythm: Normal rate and regular rhythm.     Pulses: Normal pulses.     Heart sounds: Normal heart sounds. No murmur heard.    No friction rub. No gallop.  Pulmonary:     Effort: Pulmonary effort is normal. No respiratory distress.     Breath sounds: Normal breath sounds. No wheezing.  Abdominal:     General: Bowel sounds are normal. There is no distension.     Palpations: Abdomen is soft.     Tenderness: There is no abdominal tenderness. There is no guarding.  Musculoskeletal:        General: Normal range of motion.     Cervical back: Normal range of motion and neck supple.  Lymphadenopathy:     Cervical: No cervical adenopathy.  Skin:    General: Skin is warm and dry.  Neurological:     Mental Status: She is alert and oriented to person, place, and time.     Cranial Nerves: No cranial nerve deficit.  Psychiatric:         Mood and Affect: Mood normal.        Behavior: Behavior normal.        Thought Content: Thought content normal.        Judgment: Judgment normal.      No results found for any visits on 09/23/23.     Assessment & Plan:    Routine Health Maintenance and Physical Exam  Immunization History  Administered Date(s) Administered   Influenza, Seasonal, Injecte, Preservative Fre 09/23/2023   Influenza,inj,Quad  PF,6+ Mos 10/01/2020   Influenza-Unspecified 09/12/2021   Janssen (J&J) SARS-COV-2 Vaccination 03/28/2020   Moderna SARS-COV2 Booster Vaccination 09/12/2021   Tdap 10/05/2017    Health Maintenance  Topic Date Due   Cervical Cancer Screening (HPV/Pap Cotest)  11/20/2021   COVID-19 Vaccine (3 - 2023-24 season) 10/09/2023 (Originally 08/22/2023)   Hepatitis C Screening  09/22/2024 (Originally 04/02/2000)   DTaP/Tdap/Td (2 - Td or Tdap) 10/06/2027   INFLUENZA VACCINE  Completed   HIV Screening  Completed   HPV VACCINES  Aged Out    Discussed health benefits of physical activity, and encouraged her to engage in regular exercise appropriate for her age and condition.  1. Annual physical exam Checking labs as below.  Up-to-date on most preventative care.  Would recommend updating dental care at the earliest convenience.  Wellness information provided with AVS. - CBC with Differential/Platelet - CMP14+EGFR  2. Cervical cancer screening Pap smear with HPV cotesting completed today. - Cytology - PAP  3. Encounter for screening mammogram for malignant neoplasm of breast Mammogram ordered. - MM DIGITAL SCREENING BILATERAL; Future  4. Anomalous optic nerve (HCC) Followed closely by ophthalmology.  5. Need for influenza vaccination Flu vaccine given in office today. - Flu vaccine trivalent PF, 6mos and older(Flulaval,Afluria,Fluarix,Fluzone)  6. Gastroesophageal reflux disease without esophagitis Continue Protonix 40 mg daily on an as-needed basis. - pantoprazole (PROTONIX) 40  MG tablet; Take 1 tablet (40 mg total) by mouth daily.  Dispense: 90 tablet; Refill: 1  7. Lipid screening Checking lipids today. - Lipid panel  8. Acute diffuse otitis externa of right ear Right ear exam findings notable for otitis externa.  Adding Ciprodex 4 drops to the right ear twice daily for 7 days.  9. Perianal itch Unclear etiology.  This has been a long-term issue and suspect some tissue changes consistent with lichen sclerosis but unable to fully diagnose without biopsy.  For now, we will trial Lotrisone twice daily as needed to the affected area for up to 14 days.  Return in about 1 year (around 09/22/2024) for annual physical exam or sooner if needed.     Christen Butter, NP

## 2023-09-24 LAB — CMP14+EGFR
ALT: 13 [IU]/L (ref 0–32)
AST: 16 [IU]/L (ref 0–40)
Albumin: 4.4 g/dL (ref 3.9–4.9)
Alkaline Phosphatase: 59 [IU]/L (ref 44–121)
BUN/Creatinine Ratio: 13 (ref 9–23)
BUN: 10 mg/dL (ref 6–24)
Bilirubin Total: 0.4 mg/dL (ref 0.0–1.2)
CO2: 22 mmol/L (ref 20–29)
Calcium: 9.3 mg/dL (ref 8.7–10.2)
Chloride: 101 mmol/L (ref 96–106)
Creatinine, Ser: 0.77 mg/dL (ref 0.57–1.00)
Globulin, Total: 2.5 g/dL (ref 1.5–4.5)
Glucose: 84 mg/dL (ref 70–99)
Potassium: 3.6 mmol/L (ref 3.5–5.2)
Sodium: 139 mmol/L (ref 134–144)
Total Protein: 6.9 g/dL (ref 6.0–8.5)
eGFR: 99 mL/min/{1.73_m2} (ref >59)

## 2023-09-24 LAB — CBC WITH DIFFERENTIAL/PLATELET
Basophils Absolute: 0.1 x10E3/uL (ref 0.0–0.2)
Basos: 1 %
EOS (ABSOLUTE): 1.8 x10E3/uL — ABNORMAL HIGH (ref 0.0–0.4)
Eos: 15 %
Hematocrit: 41.3 % (ref 34.0–46.6)
Hemoglobin: 13 g/dL (ref 11.1–15.9)
Immature Grans (Abs): 0 x10E3/uL (ref 0.0–0.1)
Immature Granulocytes: 0 %
Lymphocytes Absolute: 2.2 x10E3/uL (ref 0.7–3.1)
Lymphs: 19 %
MCH: 28 pg (ref 26.6–33.0)
MCHC: 31.5 g/dL (ref 31.5–35.7)
MCV: 89 fL (ref 79–97)
Monocytes Absolute: 0.6 x10E3/uL (ref 0.1–0.9)
Monocytes: 6 %
Neutrophils Absolute: 6.8 x10E3/uL (ref 1.4–7.0)
Neutrophils: 59 %
Platelets: 357 x10E3/uL (ref 150–450)
RBC: 4.64 x10E6/uL (ref 3.77–5.28)
RDW: 14.7 % (ref 11.7–15.4)
WBC: 11.5 x10E3/uL — ABNORMAL HIGH (ref 3.4–10.8)

## 2023-09-24 LAB — LIPID PANEL
Chol/HDL Ratio: 2.9 {ratio} (ref 0.0–4.4)
Cholesterol, Total: 173 mg/dL (ref 100–199)
HDL: 59 mg/dL (ref >39)
LDL Chol Calc (NIH): 95 mg/dL (ref 0–99)
Triglycerides: 105 mg/dL (ref 0–149)
VLDL Cholesterol Cal: 19 mg/dL (ref 5–40)

## 2023-09-27 LAB — CYTOLOGY - PAP
Comment: NEGATIVE
Diagnosis: NEGATIVE
High risk HPV: NEGATIVE

## 2024-07-27 ENCOUNTER — Telehealth: Payer: Self-pay

## 2024-07-27 NOTE — Telephone Encounter (Signed)
 Left message asking about a recent mammogram.

## 2024-09-25 ENCOUNTER — Encounter: Admitting: Medical-Surgical

## 2024-09-26 ENCOUNTER — Encounter: Admitting: Medical-Surgical

## 2024-10-02 ENCOUNTER — Ambulatory Visit (INDEPENDENT_AMBULATORY_CARE_PROVIDER_SITE_OTHER): Admitting: Medical-Surgical

## 2024-10-02 ENCOUNTER — Encounter: Payer: Self-pay | Admitting: Medical-Surgical

## 2024-10-02 VITALS — BP 117/77 | HR 70 | Resp 20 | Ht 68.0 in | Wt 242.8 lb

## 2024-10-02 DIAGNOSIS — Z0000 Encounter for general adult medical examination without abnormal findings: Secondary | ICD-10-CM

## 2024-10-02 DIAGNOSIS — K219 Gastro-esophageal reflux disease without esophagitis: Secondary | ICD-10-CM

## 2024-10-02 DIAGNOSIS — J4540 Moderate persistent asthma, uncomplicated: Secondary | ICD-10-CM | POA: Diagnosis not present

## 2024-10-02 DIAGNOSIS — Q078 Other specified congenital malformations of nervous system: Secondary | ICD-10-CM

## 2024-10-02 DIAGNOSIS — Z1231 Encounter for screening mammogram for malignant neoplasm of breast: Secondary | ICD-10-CM

## 2024-10-02 DIAGNOSIS — Z23 Encounter for immunization: Secondary | ICD-10-CM

## 2024-10-02 DIAGNOSIS — Z720 Tobacco use: Secondary | ICD-10-CM

## 2024-10-02 MED ORDER — PULMICORT FLEXHALER 180 MCG/ACT IN AEPB: 2.0000 | INHALATION_SPRAY | Freq: Two times a day (BID) | RESPIRATORY_TRACT | 1 refills | Status: AC

## 2024-10-02 MED ORDER — LEVOCETIRIZINE DIHYDROCHLORIDE 5 MG PO TABS: 5.0000 mg | ORAL_TABLET | Freq: Every evening | ORAL | 3 refills | Status: AC

## 2024-10-02 MED ORDER — AIRSUPRA 90-80 MCG/ACT IN AERO: 2.0000 | INHALATION_SPRAY | Freq: Four times a day (QID) | RESPIRATORY_TRACT | 11 refills | Status: AC | PRN

## 2024-10-02 MED ORDER — PANTOPRAZOLE SODIUM 40 MG PO TBEC: 40.0000 mg | DELAYED_RELEASE_TABLET | ORAL | 1 refills | Status: AC | PRN

## 2024-10-02 MED ORDER — FLUTICASONE PROPIONATE 50 MCG/ACT NA SUSP: 2.0000 | Freq: Every day | NASAL | 0 refills | Status: AC

## 2024-10-02 NOTE — Progress Notes (Signed)
 Complete physical exam  Patient: Martha Ware   DOB: 1981-03-03   43 y.o. Female  MRN: [id number]  Subjective:    Chief Complaint  Patient presents with   Annual Exam    Martha Ware is a 43 y.o. female who presents today for a complete physical exam. She reports consuming a mostly plant based diet. The patient does not participate in regular exercise at present. She generally feels well. She reports sleeping well. She does not have additional problems to discuss today.    Most recent fall risk assessment:    09/23/2023    1:55 PM  Fall Risk   Falls in the past year? 1  Number falls in past yr: 0  Injury with Fall? 1  Risk for fall due to : History of fall(s)  Follow up Falls evaluation completed     Most recent depression screenings:    10/02/2024    3:18 PM 09/23/2023    1:55 PM  PHQ 2/9 Scores  PHQ - 2 Score 0 2  PHQ- 9 Score  10    Vision:Not within last year  and Dental: No current dental problems and No regular dental care     Patient Care Team: Willo Mini, NP as PCP - General (Nurse Practitioner)   Outpatient Medications Prior to Visit  Medication Sig   clotrimazole -betamethasone  (LOTRISONE ) cream Apply 1 Application topically 2 (two) times daily as needed. Use up to 14 days consecutively then take a break for 2 weeks.   levalbuterol (XOPENEX HFA) 45 MCG/ACT inhaler Inhale 2 puffs into the lungs every 6 (six) hours as needed for wheezing.   [DISCONTINUED] fluticasone  (FLONASE ) 50 MCG/ACT nasal spray Place 2 sprays into both nostrils daily.   [DISCONTINUED] levocetirizine (XYZAL ) 5 MG tablet Take 1 tablet (5 mg total) by mouth every evening.   [DISCONTINUED] pantoprazole  (PROTONIX ) 40 MG tablet Take 1 tablet (40 mg total) by mouth daily. (Patient taking differently: Take 40 mg by mouth as needed.)   [DISCONTINUED] PULMICORT FLEXHALER 180 MCG/ACT inhaler Inhale 2 puffs into the lungs 2 (two) times daily.   [DISCONTINUED] albuterol (VENTOLIN HFA) 108 (90  Base) MCG/ACT inhaler Inhale 2 puffs into the lungs every 6 (six) hours as needed.    [DISCONTINUED] b complex vitamins capsule Take 1 capsule by mouth daily.   [DISCONTINUED] budesonide-formoterol (SYMBICORT) 160-4.5 MCG/ACT inhaler    [DISCONTINUED] Cholecalciferol (VITAMIN D-3) 125 MCG (5000 UT) TABS Take 1 tablet by mouth daily.   [DISCONTINUED] cloNIDine (CATAPRES) 0.1 MG tablet Take 0.1 mg by mouth daily as needed.   [DISCONTINUED] FLUoxetine (PROZAC) 40 MG capsule    [DISCONTINUED] hydrOXYzine  (ATARAX ) 25 MG tablet Take 1 tablet (25 mg total) by mouth every 6 (six) hours.   [DISCONTINUED] Multiple Vitamin tablet Take 1 tablet by mouth daily.   [DISCONTINUED] Omega-3 Fatty Acids (FISH OIL) 1000 MG CAPS Take 1 capsule by mouth daily.   No facility-administered medications prior to visit.    Review of Systems  Constitutional:  Negative for chills, fever, malaise/fatigue and weight loss.  HENT:  Negative for congestion, ear pain, hearing loss, sinus pain and sore throat.   Eyes:  Negative for blurred vision, photophobia and pain.  Respiratory:  Positive for cough and shortness of breath. Negative for wheezing.   Cardiovascular:  Positive for palpitations. Negative for chest pain and leg swelling.  Gastrointestinal:  Negative for abdominal pain, constipation, diarrhea, heartburn, nausea and vomiting.  Genitourinary:  Negative for dysuria, frequency and urgency.  Musculoskeletal:  Negative for falls and neck pain.  Skin:  Negative for itching and rash.  Neurological:  Negative for dizziness, weakness and headaches.  Endo/Heme/Allergies:  Negative for polydipsia. Does not bruise/bleed easily.  Psychiatric/Behavioral:  Positive for substance abuse (Marijuana). Negative for depression and suicidal ideas. The patient is nervous/anxious. The patient does not have insomnia.      Objective:     BP 117/77 (BP Location: Left Arm, Cuff Size: Normal)   Pulse 70   Resp 20   Ht 5' 8 (1.727 m)    Wt 242 lb 12.8 oz (110.1 kg)   SpO2 99%   BMI 36.92 kg/m    Physical Exam Constitutional:      General: She is not in acute distress.    Appearance: Normal appearance. She is obese. She is not ill-appearing.  HENT:     Head: Normocephalic and atraumatic.     Right Ear: Tympanic membrane, ear canal and external ear normal. There is no impacted cerumen.     Left Ear: Tympanic membrane, ear canal and external ear normal. There is no impacted cerumen.     Nose: Nose normal. No congestion or rhinorrhea.     Mouth/Throat:     Mouth: Mucous membranes are moist.     Pharynx: No oropharyngeal exudate or posterior oropharyngeal erythema.  Eyes:     General: No scleral icterus.       Right eye: No discharge.        Left eye: No discharge.     Extraocular Movements: Extraocular movements intact.     Conjunctiva/sclera: Conjunctivae normal.     Pupils: Pupils are equal, round, and reactive to light.  Neck:     Thyroid: No thyromegaly.     Vascular: No carotid bruit or JVD.     Trachea: Trachea normal.  Cardiovascular:     Rate and Rhythm: Normal rate and regular rhythm.     Pulses: Normal pulses.     Heart sounds: Normal heart sounds. No murmur heard.    No friction rub. No gallop.  Pulmonary:     Effort: Pulmonary effort is normal. No respiratory distress.     Breath sounds: Normal breath sounds. No wheezing.  Abdominal:     General: Bowel sounds are normal. There is no distension.     Palpations: Abdomen is soft.     Tenderness: There is no abdominal tenderness. There is no guarding.  Musculoskeletal:        General: Normal range of motion.     Cervical back: Normal range of motion and neck supple.  Lymphadenopathy:     Cervical: No cervical adenopathy.  Skin:    General: Skin is warm and dry.  Neurological:     Mental Status: She is alert and oriented to person, place, and time.     Cranial Nerves: No cranial nerve deficit.  Psychiatric:        Mood and Affect: Mood  normal.        Behavior: Behavior normal.        Thought Content: Thought content normal.        Judgment: Judgment normal.      No results found for any visits on 10/02/24.     Assessment & Plan:    Routine Health Maintenance and Physical Exam  Immunization History  Administered Date(s) Administered   Influenza, Seasonal, Injecte, Preservative Fre 09/23/2023, 10/02/2024   Influenza,inj,Quad PF,6+ Mos 10/01/2020   Influenza-Unspecified 09/12/2021   Janssen (J&J) SARS-COV-2 Vaccination 03/28/2020  Moderna SARS-COV2 Booster Vaccination 09/12/2021   Tdap 10/05/2017    Health Maintenance  Topic Date Due   Hepatitis C Screening  Never done   Pneumococcal Vaccine (1 of 2 - PCV) Never done   Hepatitis B Vaccines 19-59 Average Risk (1 of 3 - 19+ 3-dose series) Never done   HPV VACCINES (1 - 3-dose SCDM series) Never done   Mammogram  04/25/2023   COVID-19 Vaccine (3 - 2025-26 season) 10/18/2024 (Originally 08/21/2024)   DTaP/Tdap/Td (2 - Td or Tdap) 10/06/2027   Cervical Cancer Screening (HPV/Pap Cotest)  09/22/2028   Influenza Vaccine  Completed   HIV Screening  Completed   Meningococcal B Vaccine  Aged Out    Discussed health benefits of physical activity, and encouraged her to engage in regular exercise appropriate for her age and condition.  1. Annual physical exam (Primary) Checking labs as below.  Recommend updating vision and dental care. Wellness information provided with AVS. - CBC - CMP14+EGFR - Lipid panel  2. Gastroesophageal reflux disease without esophagitis Stable.  Continue Protonix  as needed. - pantoprazole  (PROTONIX ) 40 MG tablet; Take 1 tablet (40 mg total) by mouth as needed.  Dispense: 90 tablet; Refill: 1  3. Tobacco use Only smoking 1 cigarette/day.  Notes increased congestion with full cessation.  Explained the physiological response to tobacco use and the expectation for notable improvement of symptoms with in 3-6 months of full cessation.  4.  Encounter for screening mammogram for malignant neoplasm of breast Discussed recommendations for breast cancer screening.  She is a low risk and does not feel it necessary to have mammograms yearly.  Advised that new recommendations recommend every 2-year screening.  The last mammogram she had was approximately 2 years ago so ordering updated mammogram today. - MM DIGITAL SCREENING BILATERAL; Future  5. Moderate persistent asthma, unspecified whether complicated Notes rescue inhaler use more than 2-3 times weekly but feels this may be in response to the increased congestion and chest tightness that happens when she attempts smoking cessation.  Doing well on Pulmicort, Flonase , and Xyzal .  Previously using lev albuterol as a rescue inhaler but would like to switch to Airsupra to see if that is more helpful.  Prescription sent and coupon card provided.  Continue other medications as prescribed. - budesonide (PULMICORT FLEXHALER) 180 MCG/ACT inhaler; Inhale 2 puffs into the lungs 2 (two) times daily.  Dispense: 3 each; Refill: 1 - levocetirizine (XYZAL ) 5 MG tablet; Take 1 tablet (5 mg total) by mouth every evening.  Dispense: 90 tablet; Refill: 3 - fluticasone  (FLONASE ) 50 MCG/ACT nasal spray; Place 2 sprays into both nostrils daily.  Dispense: 16 g; Refill: 0 - Albuterol-Budesonide (AIRSUPRA) 90-80 MCG/ACT AERO; Inhale 2 puffs into the lungs every 6 (six) hours as needed.  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  Referring to ophthalmology to establish a new provider. - Ambulatory referral to Ophthalmology  7. Need for influenza vaccination Flu vaccine given in office today. - Flu vaccine trivalent PF, 6mos and older(Flulaval,Afluria,Fluarix,Fluzone)  Return in about 1 year (around 10/02/2025) for annual physical exam.   Zada Palin, NP

## 2024-10-02 NOTE — Patient Instructions (Signed)
 Preventive Care 58-42 Years Old, Female  Preventive care refers to lifestyle choices and visits with your health care provider that can promote health and wellness. Preventive care visits are also called wellness exams.  What can I expect for my preventive care visit?  Counseling  Your health care provider may ask you questions about your:  Medical history, including:  Past medical problems.  Family medical history.  Pregnancy history.  Current health, including:  Menstrual cycle.  Method of birth control.  Emotional well-being.  Home life and relationship well-being.  Sexual activity and sexual health.  Lifestyle, including:  Alcohol, nicotine or tobacco, and drug use.  Access to firearms.  Diet, exercise, and sleep habits.  Work and work Astronomer.  Sunscreen use.  Safety issues such as seatbelt and bike helmet use.  Physical exam  Your health care provider will check your:  Height and weight. These may be used to calculate your BMI (body mass index). BMI is a measurement that tells if you are at a healthy weight.  Waist circumference. This measures the distance around your waistline. This measurement also tells if you are at a healthy weight and may help predict your risk of certain diseases, such as type 2 diabetes and high blood pressure.  Heart rate and blood pressure.  Body temperature.  Skin for abnormal spots.  What immunizations do I need?    Vaccines are usually given at various ages, according to a schedule. Your health care provider will recommend vaccines for you based on your age, medical history, and lifestyle or other factors, such as travel or where you work.  What tests do I need?  Screening  Your health care provider may recommend screening tests for certain conditions. This may include:  Lipid and cholesterol levels.  Diabetes screening. This is done by checking your blood sugar (glucose) after you have not eaten for a while (fasting).  Pelvic exam and Pap test.  Hepatitis B test.  Hepatitis C  test.  HIV (human immunodeficiency virus) test.  STI (sexually transmitted infection) testing, if you are at risk.  Lung cancer screening.  Colorectal cancer screening.  Mammogram. Talk with your health care provider about when you should start having regular mammograms. This may depend on whether you have a family history of breast cancer.  BRCA-related cancer screening. This may be done if you have a family history of breast, ovarian, tubal, or peritoneal cancers.  Bone density scan. This is done to screen for osteoporosis.  Talk with your health care provider about your test results, treatment options, and if necessary, the need for more tests.  Follow these instructions at home:  Eating and drinking    Eat a diet that includes fresh fruits and vegetables, whole grains, lean protein, and low-fat dairy products.  Take vitamin and mineral supplements as recommended by your health care provider.  Do not drink alcohol if:  Your health care provider tells you not to drink.  You are pregnant, may be pregnant, or are planning to become pregnant.  If you drink alcohol:  Limit how much you have to 0-1 drink a day.  Know how much alcohol is in your drink. In the [place] one drink equals one 12 oz bottle of beer (355 mL), one 5 oz glass of wine (148 mL), or one 1 oz glass of hard liquor (44 mL).  Lifestyle  Brush your teeth every morning and night with fluoride toothpaste. Floss one time each day.  Exercise for at least  30 minutes 5 or more days each week.  Do not use any products that contain nicotine or tobacco. These products include cigarettes, chewing tobacco, and vaping devices, such as e-cigarettes. If you need help quitting, ask your health care provider.  Do not use drugs.  If you are sexually active, practice safe sex. Use a condom or other form of protection to prevent STIs.  If you do not wish to become pregnant, use a form of birth control. If you plan to become pregnant, see your health care provider for a  prepregnancy visit.  Take aspirin only as told by your health care provider. Make sure that you understand how much to take and what form to take. Work with your health care provider to find out whether it is safe and beneficial for you to take aspirin daily.  Find healthy ways to manage stress, such as:  Meditation, yoga, or listening to music.  Journaling.  Talking to a trusted person.  Spending time with friends and family.  Minimize exposure to UV radiation to reduce your risk of skin cancer.  Safety  Always wear your seat belt while driving or riding in a vehicle.  Do not drive:  If you have been drinking alcohol. Do not ride with someone who has been drinking.  When you are tired or distracted.  While texting.  If you have been using any mind-altering substances or drugs.  Wear a helmet and other protective equipment during sports activities.  If you have firearms in your house, make sure you follow all gun safety procedures.  Seek help if you have been physically or sexually abused.  What's next?  Visit your health care provider once a year for an annual wellness visit.  Ask your health care provider how often you should have your eyes and teeth checked.  Stay up to date on all vaccines.  This information is not intended to replace advice given to you by your health care provider. Make sure you discuss any questions you have with your health care provider.  Document Revised: 06/04/2021 Document Reviewed: 06/04/2021  Elsevier Patient Education  2024 [employer].

## 2024-10-03 ENCOUNTER — Ambulatory Visit: Payer: Self-pay | Admitting: Medical-Surgical

## 2024-10-03 LAB — CBC
Hematocrit: 36.9 % (ref 34.0–46.6)
Hemoglobin: 11.8 g/dL (ref 11.1–15.9)
MCH: 27.7 pg (ref 26.6–33.0)
MCHC: 32 g/dL (ref 31.5–35.7)
MCV: 87 fL (ref 79–97)
Platelets: 353 x10E3/uL (ref 150–450)
RBC: 4.26 x10E6/uL (ref 3.77–5.28)
RDW: 13.9 % (ref 11.7–15.4)
WBC: 10.9 x10E3/uL — ABNORMAL HIGH (ref 3.4–10.8)

## 2024-10-03 LAB — LIPID PANEL
Chol/HDL Ratio: 2.2 ratio (ref 0.0–4.4)
Cholesterol, Total: 146 mg/dL (ref 100–199)
HDL: 65 mg/dL (ref >39)
LDL Chol Calc (NIH): 67 mg/dL (ref 0–99)
Triglycerides: 71 mg/dL (ref 0–149)
VLDL Cholesterol Cal: 14 mg/dL (ref 5–40)

## 2024-10-03 LAB — CMP14+EGFR
ALT: 19 IU/L (ref 0–32)
AST: 14 IU/L (ref 0–40)
Albumin: 4.3 g/dL (ref 3.9–4.9)
Alkaline Phosphatase: 67 IU/L (ref 41–116)
BUN/Creatinine Ratio: 13 (ref 9–23)
BUN: 9 mg/dL (ref 6–24)
Bilirubin Total: 0.3 mg/dL (ref 0.0–1.2)
CO2: 24 mmol/L (ref 20–29)
Calcium: 9.2 mg/dL (ref 8.7–10.2)
Chloride: 103 mmol/L (ref 96–106)
Creatinine, Ser: 0.71 mg/dL (ref 0.57–1.00)
Globulin, Total: 2.7 g/dL (ref 1.5–4.5)
Glucose: 83 mg/dL (ref 70–99)
Potassium: 4.2 mmol/L (ref 3.5–5.2)
Sodium: 139 mmol/L (ref 134–144)
Total Protein: 7 g/dL (ref 6.0–8.5)
eGFR: 109 mL/min/1.73 (ref >59)

## 2024-10-12 ENCOUNTER — Telehealth (HOSPITAL_BASED_OUTPATIENT_CLINIC_OR_DEPARTMENT_OTHER): Payer: Self-pay

## 2024-12-28 ENCOUNTER — Ambulatory Visit (HOSPITAL_BASED_OUTPATIENT_CLINIC_OR_DEPARTMENT_OTHER)

## 2024-12-28 ENCOUNTER — Ambulatory Visit (HOSPITAL_BASED_OUTPATIENT_CLINIC_OR_DEPARTMENT_OTHER)
Admission: RE | Admit: 2024-12-28 | Discharge: 2024-12-28 | Disposition: A | Discharge status: Home / Self Care | Source: Ambulatory Visit | Attending: Medical-Surgical | Admitting: Medical-Surgical

## 2024-12-28 ENCOUNTER — Encounter (HOSPITAL_BASED_OUTPATIENT_CLINIC_OR_DEPARTMENT_OTHER): Payer: Self-pay

## 2024-12-28 DIAGNOSIS — Z1231 Encounter for screening mammogram for malignant neoplasm of breast: Secondary | ICD-10-CM | POA: Insufficient documentation

## 2025-01-24 ENCOUNTER — Encounter: Payer: Self-pay | Admitting: Medical-Surgical

## 2025-01-24 MED ORDER — TRIAMCINOLONE ACETONIDE 0.1 % EX CREA: 1.0000 | TOPICAL_CREAM | Freq: Two times a day (BID) | CUTANEOUS | 2 refills | Status: AC

## 2025-10-04 ENCOUNTER — Encounter: Admitting: Medical-Surgical
